# Patient Record
Sex: Female | Born: 1955 | Hispanic: No | Marital: Married | Smoking: Current every day smoker
Health system: Southern US, Community
[De-identification: ages and names within clinical notes are randomized; demographics above are authoritative.]

## PROBLEM LIST (undated history)

## (undated) ENCOUNTER — Ambulatory Visit: Admission: EM | Payer: No Typology Code available for payment source | Source: Home / Self Care

## (undated) DIAGNOSIS — R5383 Other fatigue: Secondary | ICD-10-CM

## (undated) DIAGNOSIS — R053 Chronic cough: Secondary | ICD-10-CM

## (undated) DIAGNOSIS — F411 Generalized anxiety disorder: Secondary | ICD-10-CM

## (undated) DIAGNOSIS — F32A Depression, unspecified: Secondary | ICD-10-CM

## (undated) DIAGNOSIS — E785 Hyperlipidemia, unspecified: Secondary | ICD-10-CM

## (undated) HISTORY — DX: Depression, unspecified: F32.A

## (undated) HISTORY — DX: Chronic cough: R05.3

## (undated) HISTORY — DX: Generalized anxiety disorder: F41.1

## (undated) HISTORY — DX: Other fatigue: R53.83

## (undated) HISTORY — PX: NO PAST SURGERIES: SHX2092

## (undated) HISTORY — DX: Hyperlipidemia, unspecified: E78.5

---

## 2000-10-24 ENCOUNTER — Emergency Department (HOSPITAL_COMMUNITY): Admission: EM | Admit: 2000-10-24 | Discharge: 2000-10-24 | Payer: Self-pay | Admitting: Emergency Medicine

## 2000-10-24 ENCOUNTER — Encounter: Payer: Self-pay | Admitting: Emergency Medicine

## 2004-10-31 ENCOUNTER — Emergency Department (HOSPITAL_COMMUNITY): Admission: EM | Admit: 2004-10-31 | Discharge: 2004-11-01 | Payer: Self-pay | Admitting: Emergency Medicine

## 2008-04-23 ENCOUNTER — Ambulatory Visit: Payer: Self-pay | Admitting: General Practice

## 2008-05-01 ENCOUNTER — Ambulatory Visit: Payer: Self-pay | Admitting: General Practice

## 2008-05-26 ENCOUNTER — Encounter: Admission: RE | Admit: 2008-05-26 | Discharge: 2008-05-26 | Payer: Self-pay | Admitting: Family Medicine

## 2008-10-30 ENCOUNTER — Encounter: Admission: RE | Admit: 2008-10-30 | Discharge: 2008-10-30 | Payer: Self-pay | Admitting: General Surgery

## 2009-08-24 IMAGING — US ULTRASOUND LEFT BREAST
1 series · 17 of 25 positions shown · non-contrast
Comparison: none

REASON FOR EXAM: Left breast nodularity
COMMENTS:

[Series 1: ultrasound left breast · 17 of 28 slices shown]
[im 1/28]
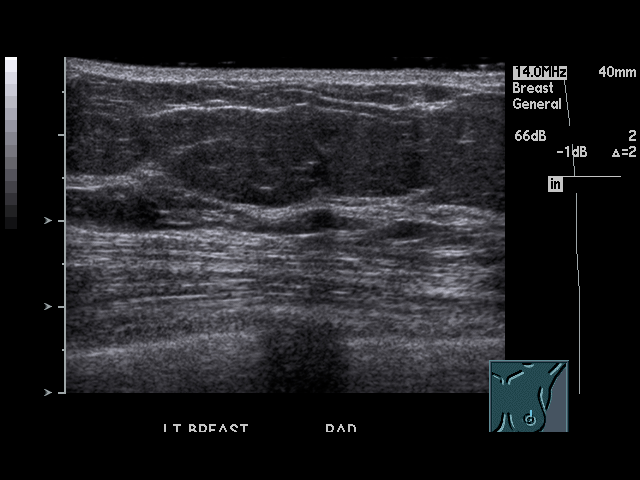
[im 3/28]
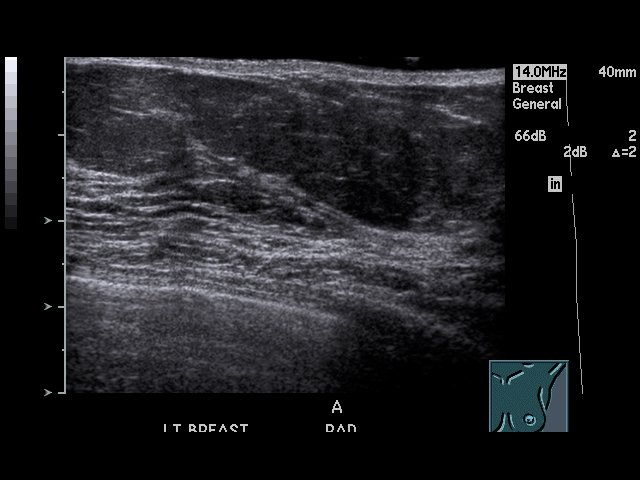
[im 4/28]
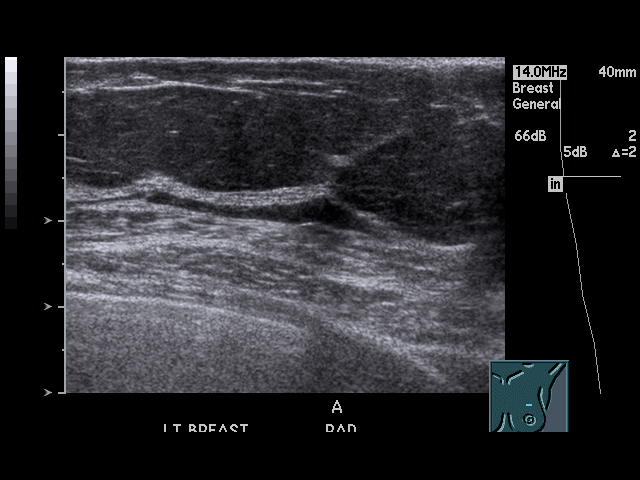
[im 6/28]
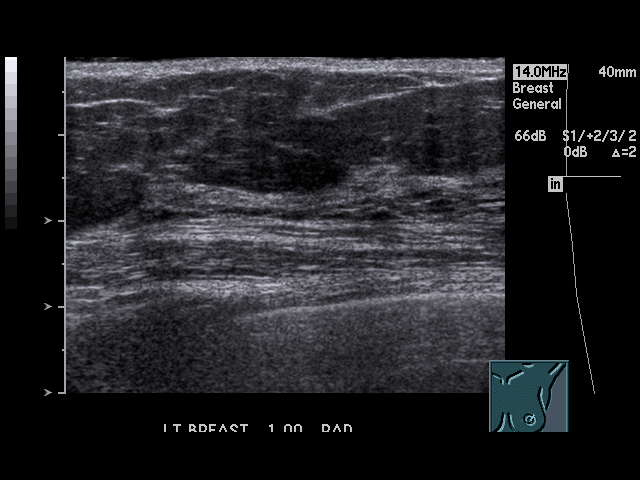
[im 7/28]
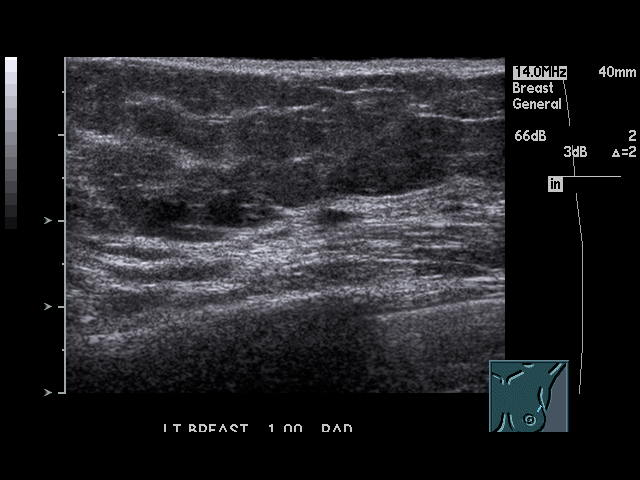
[im 10/28]
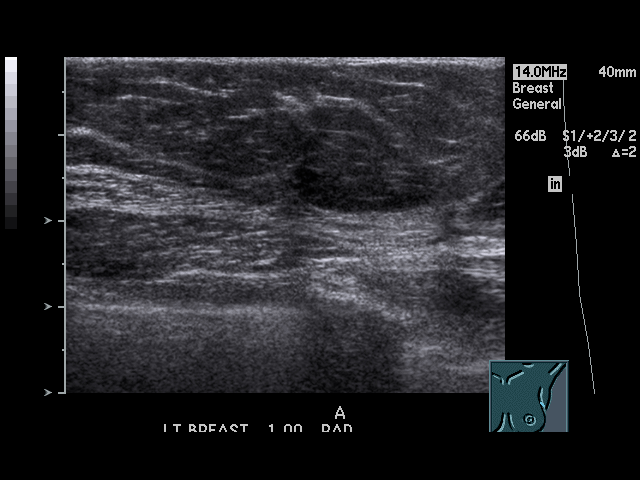
[im 11/28]
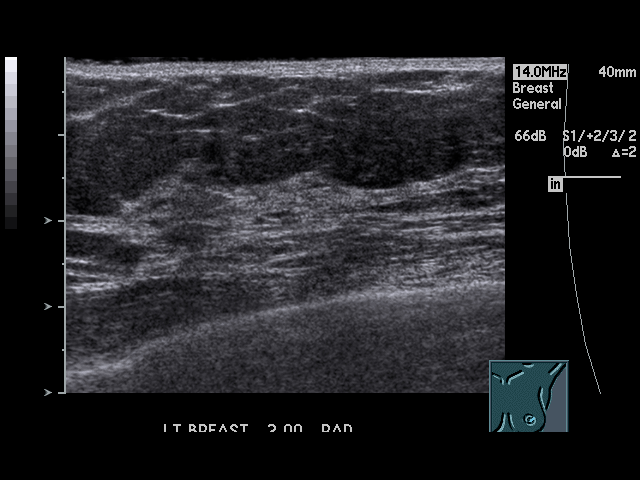
[im 13/28]
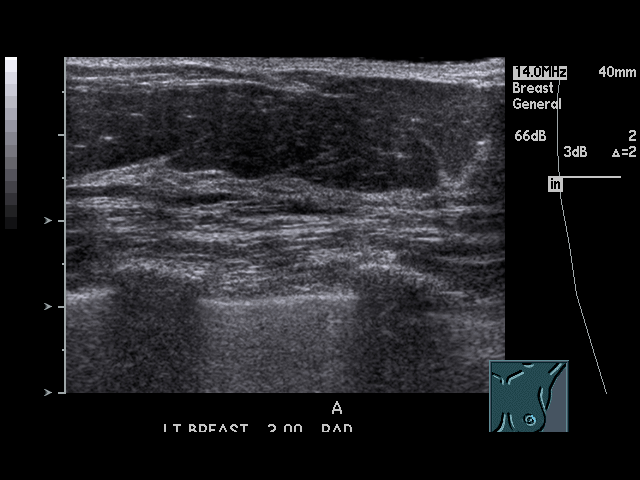
[im 14/28]
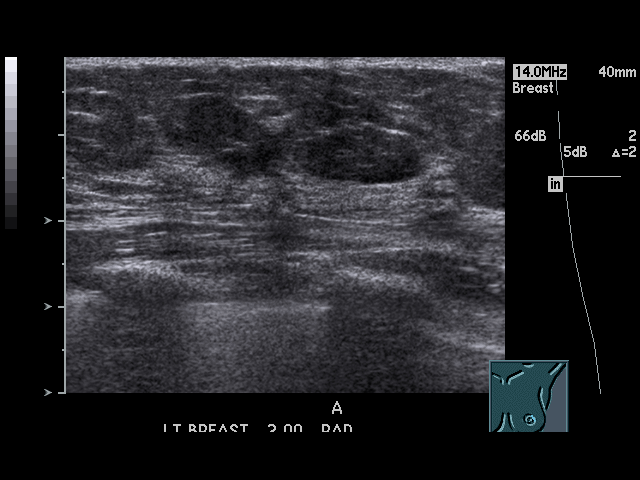
[im 15/28]
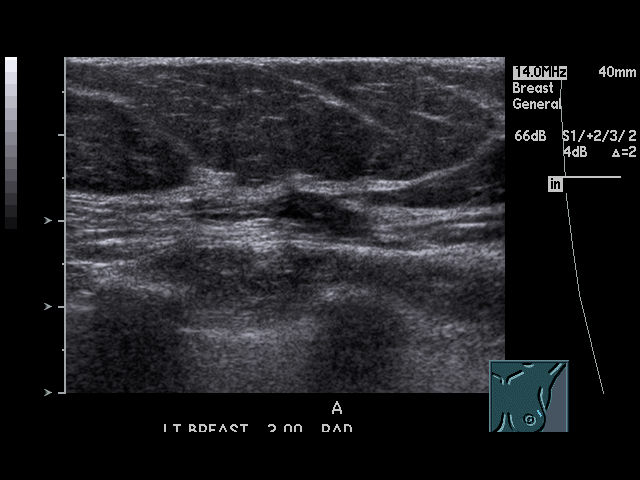
[im 17/28]
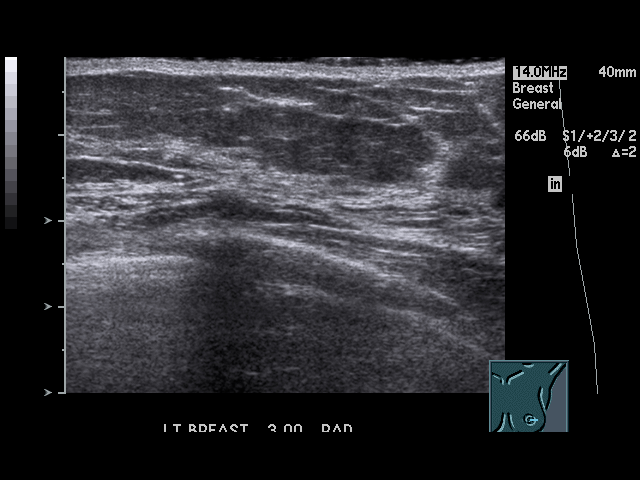
[im 19/28]
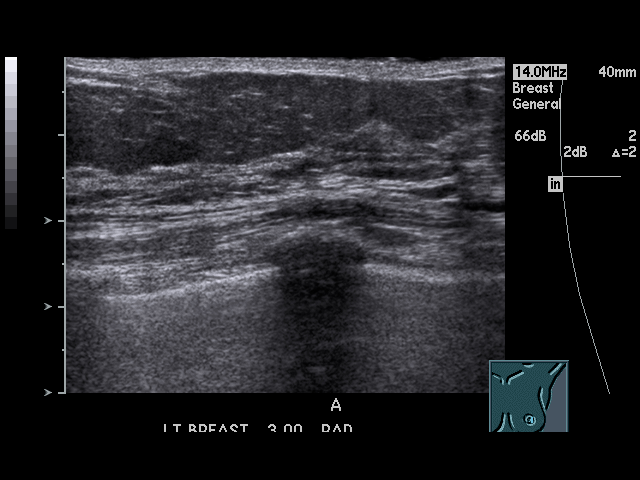
[im 21/28]
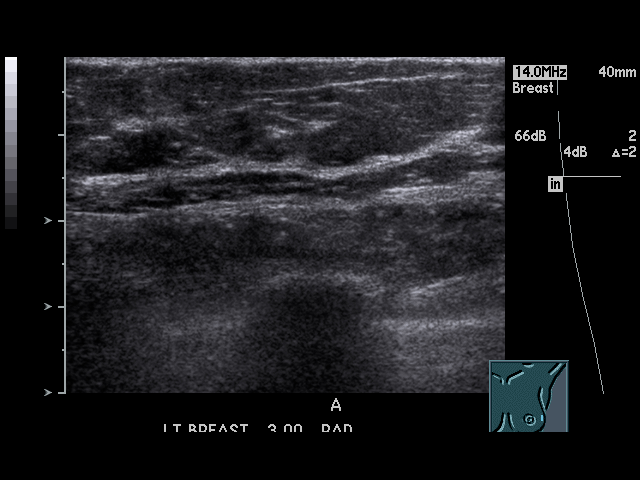
[im 22/28]
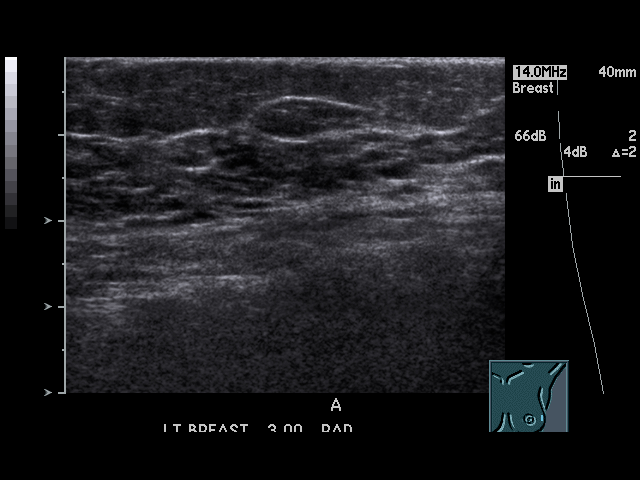
[im 24/28]
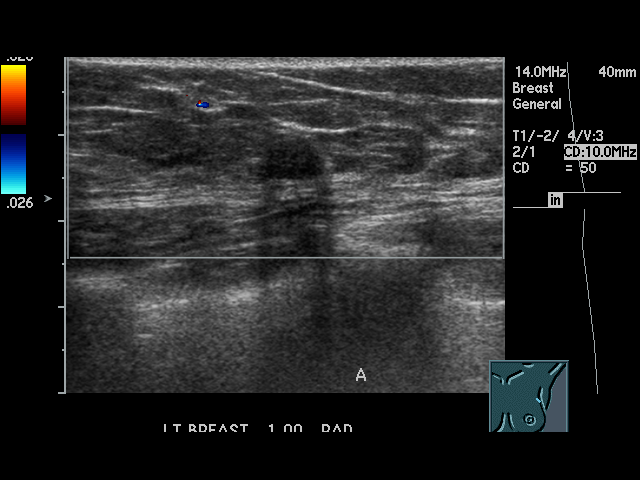
[im 25/28]
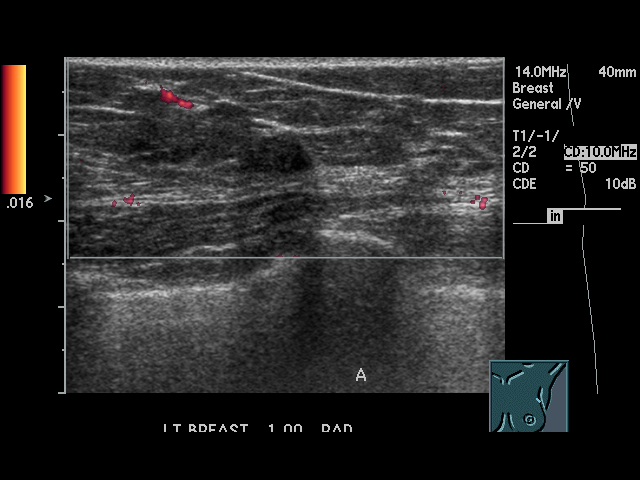
[im 28/28]
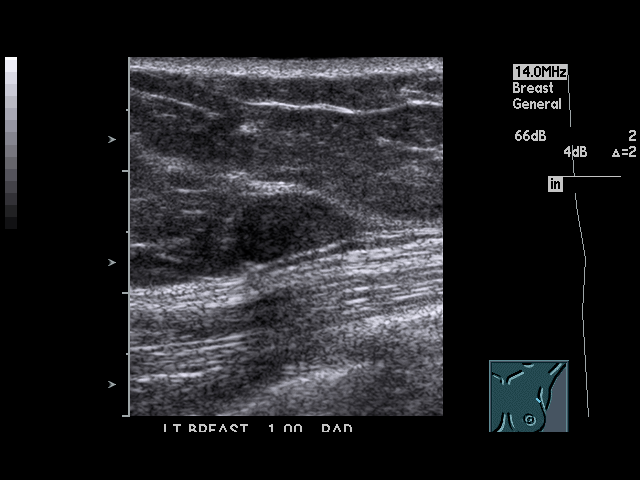

[17 of 25 positions shown; findings below may reference images not displayed]

PROCEDURE:     US  - US BREAST LEFT  - May 01, 2008 [DATE]

RESULT:     Directed ultrasound to the upper outer aspect of the LEFT breast
reveals an area of decreased echogenicity with distal shadowing that is
reasonably well marginated at approximately the 1 o'clock position. It
measures 8.0 x 5.0 x 9.0 mm. I do not see evidence of simple cysts.
IMPRESSION: There is an indeterminate nodule at the 1 o'clock position
of the LEFT breast. Please see the dictation of the mammogram of this same
day for final recommendations and BI-RADS classification.

## 2020-04-27 ENCOUNTER — Emergency Department (HOSPITAL_COMMUNITY): Payer: PRIVATE HEALTH INSURANCE

## 2020-04-27 ENCOUNTER — Emergency Department (HOSPITAL_COMMUNITY)
Admission: EM | Admit: 2020-04-27 | Discharge: 2020-04-28 | Disposition: A | Discharge status: Home / Self Care | Payer: PRIVATE HEALTH INSURANCE | Attending: Emergency Medicine | Admitting: Emergency Medicine

## 2020-04-27 ENCOUNTER — Encounter (HOSPITAL_COMMUNITY): Payer: Self-pay

## 2020-04-27 ENCOUNTER — Other Ambulatory Visit: Payer: Self-pay

## 2020-04-27 DIAGNOSIS — R059 Cough, unspecified: Secondary | ICD-10-CM | POA: Diagnosis present

## 2020-04-27 DIAGNOSIS — U071 COVID-19: Secondary | ICD-10-CM | POA: Diagnosis not present

## 2020-04-27 LAB — BASIC METABOLIC PANEL
Anion gap: 13 (ref 5–15)
BUN: 9 mg/dL (ref 8–23)
CO2: 26 mmol/L (ref 22–32)
Calcium: 9.3 mg/dL (ref 8.9–10.3)
Chloride: 100 mmol/L (ref 98–111)
Creatinine, Ser: 0.67 mg/dL (ref 0.44–1.00)
GFR, Estimated: >60 mL/min (ref >60)
Glucose, Bld: 173 mg/dL — ABNORMAL HIGH (ref 70–99)
Potassium: 3.7 mmol/L (ref 3.5–5.1)
Sodium: 139 mmol/L (ref 135–145)

## 2020-04-27 LAB — CBC
HCT: 42.7 % (ref 36.0–46.0)
Hemoglobin: 13.6 g/dL (ref 12.0–15.0)
MCH: 27.8 pg (ref 26.0–34.0)
MCHC: 31.9 g/dL (ref 30.0–36.0)
MCV: 87.1 fL (ref 80.0–100.0)
Platelets: 223 10*3/uL (ref 150–400)
RBC: 4.9 MIL/uL (ref 3.87–5.11)
RDW: 13.9 % (ref 11.5–15.5)
WBC: 4.6 10*3/uL (ref 4.0–10.5)
nRBC: 0 % (ref 0.0–0.2)

## 2020-04-27 NOTE — ED Triage Notes (Signed)
Bosnian interpreter used for triage:  Pt sent from UC for further evaluation, pt tested positive for COVID today, symptoms started on Friday. Pt reports chills, fatigue, no cough or SOB. Resp e.u at this time.

## 2020-04-28 ENCOUNTER — Telehealth: Payer: Self-pay | Admitting: Registered Nurse

## 2020-04-28 ENCOUNTER — Emergency Department (HOSPITAL_COMMUNITY): Payer: PRIVATE HEALTH INSURANCE

## 2020-04-28 ENCOUNTER — Encounter: Payer: Self-pay | Admitting: Registered Nurse

## 2020-04-28 DIAGNOSIS — U071 COVID-19: Secondary | ICD-10-CM

## 2020-04-28 MED ORDER — ONDANSETRON 4 MG PO TBDP: 4.0000 mg | ORAL_TABLET | Freq: Three times a day (TID) | ORAL | 0 refills | Status: DC | PRN

## 2020-04-28 MED ORDER — BENZONATATE 100 MG PO CAPS: 100.0000 mg | ORAL_CAPSULE | Freq: Three times a day (TID) | ORAL | 0 refills | Status: DC

## 2020-04-28 MED ORDER — ALBUTEROL SULFATE HFA 108 (90 BASE) MCG/ACT IN AERS
1.0000 | INHALATION_SPRAY | Freq: Four times a day (QID) | RESPIRATORY_TRACT | 0 refills | Status: DC | PRN
Start: 2020-04-28 — End: 2021-08-27

## 2020-04-28 NOTE — Discharge Instructions (Addendum)
Take tylenol 2 pills 4 times a day and motrin 4 pills 3 times a day.  Drink plenty of fluids.  Return for worsening shortness of breath, headache, confusion. Follow up with your family doctor.   

## 2020-04-28 NOTE — Patient Instructions (Signed)
How to Use a Metered Dose Inhaler A metered dose inhaler is a handheld device for taking medicine that must be breathed into the lungs (inhaled). The device can be used to deliver a variety of inhaled medicines, including:  Quick relief or rescue medicines, such as bronchodilators.  Controller medicines, such as corticosteroids. The medicine is delivered by pushing down on a metal canister to release a preset amount of spray and medicine. Each device contains the amount of medicine that is needed for a preset number of uses (inhalations). Your health care provider may recommend that you use a spacer with your inhaler to help you take the medicine more effectively. A spacer is a plastic tube with a mouthpiece on one end and an opening that connects to the inhaler on the other end. A spacer holds the medicine in a tube for a short time, which allows you to inhale more medicine. What are the risks? If you do not use your inhaler correctly, medicine might not reach your lungs to help you breathe. Inhaler medicine can cause side effects, such as:  Mouth or throat infection.  Cough.  Hoarseness.  Headache.  Nausea and vomiting.  Lung infection (pneumonia) in people who have a lung condition called COPD. How to use a metered dose inhaler without a spacer  1. Remove the cap from the inhaler. 2. If you are using the inhaler for the first time, shake it for 5 seconds, turn it away from your face, then release 4 puffs into the air. This is called priming. 3. Shake the inhaler for 5 seconds. 4. Position the inhaler so the top of the canister faces up. 5. Put your index finger on the top of the medicine canister. Support the bottom of the inhaler with your thumb. 6. Breathe out normally and as completely as possible, away from the inhaler. 7. Either place the inhaler between your teeth and close your lips tightly around the mouthpiece, or hold the inhaler 1-2 inches (2.5-5 cm) away from your open  mouth. Keep your tongue down out of the way. If you are unsure which technique to use, ask your health care provider. 8. Press the canister down with your index finger to release the medicine, then inhale deeply and slowly through your mouth (not your nose) until your lungs are completely filled. Inhaling should take 4-6 seconds. 9. Hold the medicine in your lungs for 5-10 seconds (10 seconds is best). This helps the medicine get into the small airways of your lungs. 10. With your lips in a tight circle (pursed), breathe out slowly. 11. Repeat steps 3-10 until you have taken the number of puffs that your health care provider directed. Wait about 1 minute between puffs or as directed. 12. Put the cap on the inhaler. 13. If you are using a steroid inhaler, rinse your mouth with water, gargle, and spit out the water. Do not swallow the water. How to use a metered dose inhaler with a spacer  1. Remove the cap from the inhaler. 2. If you are using the inhaler for the first time, shake it for 5 seconds, turn it away from your face, then release 4 puffs into the air. This is called priming. 3. Shake the inhaler for 5 seconds. 4. Place the open end of the spacer onto the inhaler mouthpiece. 5. Position the inhaler so the top of the canister faces up and the spacer mouthpiece faces you. 6. Put your index finger on the top of the medicine canister.   Support the bottom of the inhaler and the spacer with your thumb. 7. Breathe out normally and as completely as possible, away from the spacer. 8. Place the spacer between your teeth and close your lips tightly around it. Keep your tongue down out of the way. 9. Press the canister down with your index finger to release the medicine, then inhale deeply and slowly through your mouth (not your nose) until your lungs are completely filled. Inhaling should take 4-6 seconds. 10. Hold the medicine in your lungs for 5-10 seconds (10 seconds is best). This helps the  medicine get into the small airways of your lungs. 11. With your lips in a tight circle (pursed), breathe out slowly. 12. Repeat steps 3-11 until you have taken the number of puffs that your health care provider directed. Wait about 1 minute between puffs or as directed. 13. Remove the spacer from the inhaler and put the cap on the inhaler. 14. If you are using a steroid inhaler, rinse your mouth with water, gargle, and spit out the water. Do not swallow the water. Follow these instructions at home:  Take your inhaled medicine only as told by your health care provider. Do not use the inhaler more than directed by your health care provider.  Keep all follow-up visits as told by your health care provider. This is important.  If your inhaler has a counter, you can check it to determine how full your inhaler is. If your inhaler does not have a counter, ask your health care provider when you will need to refill your inhaler and write the refill date on a calendar or on your inhaler canister. Note that you cannot know when an inhaler is empty by shaking it.  Follow directions on the package insert for care and cleaning of your inhaler and spacer. Contact a health care provider if:  Symptoms are only partially relieved with your inhaler.  You are having trouble using your inhaler.  You have an increase in phlegm.  You have headaches. Get help right away if:  You feel little or no relief after using your inhaler.  You have dizziness.  You have a fast heart rate.  You have chills or a fever.  You have night sweats.  There is blood in your phlegm. Summary  A metered dose inhaler is a handheld device for taking medicine that must be breathed into the lungs (inhaled).  The medicine is delivered by pushing down on a metal canister to release a preset amount of spray and medicine.  Each device contains the amount of medicine that is needed for a preset number of uses (inhalations). This  information is not intended to replace advice given to you by your health care provider. Make sure you discuss any questions you have with your health care provider. Document Revised: 05/26/2017 Document Reviewed: 05/03/2016 Elsevier Patient Education  2020 [employer].  How to Use a Metered Dose Inhaler A metered dose inhaler is a handheld device for taking medicine that must be breathed into the lungs (inhaled). The device can be used to deliver a variety of inhaled medicines, including:  Quick relief or rescue medicines, such as bronchodilators.  Controller medicines, such as corticosteroids. The medicine is delivered by pushing down on a metal canister to release a preset amount of spray and medicine. Each device contains the amount of medicine that is needed for a preset number of uses (inhalations). Your health care provider may recommend that you use a spacer with  your inhaler to help you take the medicine more effectively. A spacer is a plastic tube with a mouthpiece on one end and an opening that connects to the inhaler on the other end. A spacer holds the medicine in a tube for a short time, which allows you to inhale more medicine. What are the risks? If you do not use your inhaler correctly, medicine might not reach your lungs to help you breathe. Inhaler medicine can cause side effects, such as:  Mouth or throat infection.  Cough.  Hoarseness.  Headache.  Nausea and vomiting.  Lung infection (pneumonia) in people who have a lung condition called COPD. How to use a metered dose inhaler without a spacer  14. Remove the cap from the inhaler. 15. If you are using the inhaler for the first time, shake it for 5 seconds, turn it away from your face, then release 4 puffs into the air. This is called priming. 16. Shake the inhaler for 5 seconds. 17. Position the inhaler so the top of the canister faces up. 18. Put your index finger on the top of the medicine canister. Support  the bottom of the inhaler with your thumb. 19. Breathe out normally and as completely as possible, away from the inhaler. 20. Either place the inhaler between your teeth and close your lips tightly around the mouthpiece, or hold the inhaler 1-2 inches (2.5-5 cm) away from your open mouth. Keep your tongue down out of the way. If you are unsure which technique to use, ask your health care provider. 21. Press the canister down with your index finger to release the medicine, then inhale deeply and slowly through your mouth (not your nose) until your lungs are completely filled. Inhaling should take 4-6 seconds. 22. Hold the medicine in your lungs for 5-10 seconds (10 seconds is best). This helps the medicine get into the small airways of your lungs. 23. With your lips in a tight circle (pursed), breathe out slowly. 24. Repeat steps 3-10 until you have taken the number of puffs that your health care provider directed. Wait about 1 minute between puffs or as directed. 25. Put the cap on the inhaler. 26. If you are using a steroid inhaler, rinse your mouth with water, gargle, and spit out the water. Do not swallow the water. How to use a metered dose inhaler with a spacer  15. Remove the cap from the inhaler. 16. If you are using the inhaler for the first time, shake it for 5 seconds, turn it away from your face, then release 4 puffs into the air. This is called priming. 17. Shake the inhaler for 5 seconds. 18. Place the open end of the spacer onto the inhaler mouthpiece. 19. Position the inhaler so the top of the canister faces up and the spacer mouthpiece faces you. 20. Put your index finger on the top of the medicine canister. Support the bottom of the inhaler and the spacer with your thumb. 21. Breathe out normally and as completely as possible, away from the spacer. 22. Place the spacer between your teeth and close your lips tightly around it. Keep your tongue down out of the way. 23. Press the  canister down with your index finger to release the medicine, then inhale deeply and slowly through your mouth (not your nose) until your lungs are completely filled. Inhaling should take 4-6 seconds. 24. Hold the medicine in your lungs for 5-10 seconds (10 seconds is best). This helps the medicine get into  the small airways of your lungs. 25. With your lips in a tight circle (pursed), breathe out slowly. 26. Repeat steps 3-11 until you have taken the number of puffs that your health care provider directed. Wait about 1 minute between puffs or as directed. 27. Remove the spacer from the inhaler and put the cap on the inhaler. 28. If you are using a steroid inhaler, rinse your mouth with water, gargle, and spit out the water. Do not swallow the water. Follow these instructions at home:  Take your inhaled medicine only as told by your health care provider. Do not use the inhaler more than directed by your health care provider.  Keep all follow-up visits as told by your health care provider. This is important.  If your inhaler has a counter, you can check it to determine how full your inhaler is. If your inhaler does not have a counter, ask your health care provider when you will need to refill your inhaler and write the refill date on a calendar or on your inhaler canister. Note that you cannot know when an inhaler is empty by shaking it.  Follow directions on the package insert for care and cleaning of your inhaler and spacer. Contact a health care provider if:  Symptoms are only partially relieved with your inhaler.  You are having trouble using your inhaler.  You have an increase in phlegm.  You have headaches. Get help right away if:  You feel little or no relief after using your inhaler.  You have dizziness.  You have a fast heart rate.  You have chills or a fever.  You have night sweats.  There is blood in your phlegm. Summary  A metered dose inhaler is a handheld device  for taking medicine that must be breathed into the lungs (inhaled).  The medicine is delivered by pushing down on a metal canister to release a preset amount of spray and medicine.  Each device contains the amount of medicine that is needed for a preset number of uses (inhalations). This information is not intended to replace advice given to you by your health care provider. Make sure you discuss any questions you have with your health care provider. Document Revised: 05/26/2017 Document Reviewed: 05/03/2016 Elsevier Patient Education  2020 [employer].

## 2020-04-28 NOTE — Telephone Encounter (Signed)
New Melanie Arroyo contacted via telephone english second language used interpretor Melanie Arroyo preferred [place] conference call for [place].   RN Rolly Salter, Melanie Arroyo, myself and interpretor on phoneline during entire call. Melanie Arroyo reported she had been feeling fatigued and did not feel she could complete her shift at work so called in sick.  Works in [employer] bi[employer] ([employer]ing). Melanie Arroyo does not participate in employer [employer] Well program and has only participated in mammogram breast cancer screenings in the past 2009.  Paper chart located at [facility] and Epic reviewed.   HR notified clinic staff to follow up with Melanie Arroyo as went to UC/ER and was notified had positive covid test.  Melanie Arroyo denied wheezing, difficulty breathing, blue lips/face, confusion or syncope.  Discussed if any of these happen recommend same day re-evaluation by provider.  ER recommended if worsening chest pain, difficulty breathing, confusion, and/or passing out.  Melanie Arroyo to notify clinic staff if increased effort breathing or sp02 worsening.  Coworker bringing sp02 home monitor borrowing from clinic to Melanie Arroyo home after work today.  Discussed 94-99 normal and below 90 not normal.  Noted in epic Melanie Arroyo baseline sp02 RA 97% on previous visit.  94% today in ER.  Melanie Arroyo spoke full sentences without difficulty A&Ox3 respirations even and unlabored during auditory telephone call duration 20 minutes.  Electronic Rx for albuterol MDI 1-2puffs po q4-6h prn protracted cough/wheeze/chest tightness.  ER provider rx for tessalon pearles 100mg  po TID prn cough and zofran 4mg  prn n/v  HR team notified Melanie Arroyo quarantine to end 03 May 2020 RTW 04 May 2020 as long as symptoms improving and no nausea/vomiting/diarrhea/fever or chills 24 hours prior to return to work.  Melanie Arroyo last onsite Th 28 Oct and symptoms started 28 Oct after work fatigue. Called out sick 10/29 and 11/1.  Notified supervisor positive covid test 11/2 13/1 who notified  HR team.  Day 1 quarantine 04/24/2020.  Pt began quarantine today.  Tested on day 3 urgent care per Melanie Arroyo. Melanie Arroyo did not develop sx of cough, trouble breathing, chest pain, nausea, vomiting, diarrhea, sore throat, HA, body aches, or fever.   Quarantine 10 day symptomatic recommended. Pt lives in [place].  [facility] preferred pharmacy.  Son planning to pick up her ER medication rx today.  Reviewed her Rx with Melanie Arroyo purpose and dosing tessalon pearles po TID prn cough, albuterol MDI 1-2 puffs po q4-6h prn cough/trouble breathing/low 02 and zofran prn n/v   Reviewed possible Covid sx including cough, shortness of breath with exertion or at rest, runny nose, congestion, sinus pain/pressure, sore throat, fever/chills, body aches, fatigue, loss of taste/smell, GI symptoms of nausea/vomiting/diarrhea. Also reviewed same day/emergent eval/ER precautions of dizziness/syncope, confusion, blue tint to lips/face, severe shortness of breath/difficulty breathing/wheezing/chest pain.   Melanie Arroyo to isolate in own room and if possible use only one bathroom if living with others in home.  Wear mask when out of room to help prevent spread to others in household.  Discussed monoclonal antibody (Mab) treatment with Melanie Arroyo.  She reported she doesn't feel sick enough to need that extra treatment at this time. Discussed it can only be given within 10 days of symptom start and thus 5 days remaining if decides to change mind or worsening symptoms and desires Mab treatment at [facility] I would need to put in request/order.  Discussed with Melanie Arroyo antibodies would help her body fight infection and possibly prevent hospitalization/need for oxygen.  Risk factors age 12, borderline hypertension and BMI 27. She denied health problems  except for occasional elevated blood pressure.  Not taking any medications or supplements on a daily basis.  Chest xray [place] 04/27/2020: COMPARISON:  10/31/2004  FINDINGS: The  lungs are symmetrically well expanded. Patchy peripheral interstitial and airspace infiltrate is seen within the mid and lower lung zones, likely infectious or inflammatory in the acute setting. No pneumothorax or pleural effusion. Cardiac size within normal limits. No acute bone abnormality.  IMPRESSION: Patchy multifocal pulmonary infiltrates, likely infectious or inflammatory in the acute setting.   Electronically Signed   By: Helyn Numbers MD   On: 04/28/2020 03:05  Labs yesterday Results for ANALIZ, TVEDT (MRN [id number]) as of 04/28/2020 18:22  Ref. Range 04/27/2020 18:10  Sodium Latest Ref Range: 135 - 145 mmol/L 139  Potassium Latest Ref Range: 3.5 - 5.1 mmol/L 3.7  Chloride Latest Ref Range: 98 - 111 mmol/L 100  CO2 Latest Ref Range: 22 - 32 mmol/L 26  Glucose Latest Ref Range: 70 - 99 mg/dL 242 (H)  BUN Latest Ref Range: 8 - 23 mg/dL 9  Creatinine Latest Ref Range: 0.44 - 1.00 mg/dL 6.83  Calcium Latest Ref Range: 8.9 - 10.3 mg/dL 9.3  Anion gap Latest Ref Range: 5 - 15  13  GFR, Estimated Latest Ref Range: >60 mL/min >60  WBC Latest Ref Range: 4.0 - 10.5 K/uL 4.6  RBC Latest Ref Range: 3.87 - 5.11 MIL/uL 4.90  Hemoglobin Latest Ref Range: 12.0 - 15.0 g/dL 41.9  HCT Latest Ref Range: 36 - 46 % 42.7  MCV Latest Ref Range: 80.0 - 100.0 fL 87.1  MCH Latest Ref Range: 26.0 - 34.0 pg 27.8  MCHC Latest Ref Range: 30.0 - 36.0 g/dL 62.2  RDW Latest Ref Range: 11.5 - 15.5 % 13.9  Platelets Latest Ref Range: 150 - 400 K/uL 223  nRBC Latest Ref Range: 0.0 - 0.2 % 0.0     Pt verbalized understanding and agreement with plan of care. No further questions/concerns at this time. Pt reminded to contact clinic with any changes in sx or questions/concerns [phone].

## 2020-04-28 NOTE — ED Notes (Addendum)
Pt able to ambulate without difficulty while maintaining O2 sats of 94%

## 2020-04-28 NOTE — ED Provider Notes (Signed)
[facility] EMERGENCY DEPARTMENT Provider Note   CSN: [id number] Arrival date & time: 04/27/20  1738     History Chief Complaint  Patient presents with  . Covid Positive    Melanie Arroyo is a 64 y.o. female.  64 yo F with a cc of cough congestion, sob. Going on for about 4 days now. Went to urgent care and they were concerned for multifocal pna on xray and so sent here for eval.    The history is provided by the patient. The history is limited by a language barrier. A language interpreter was used (son).  Illness Severity:  Moderate Onset quality:  Gradual Duration:  4 days Timing:  Constant Progression:  Worsening Chronicity:  New Associated symptoms: cough, fever, headaches, myalgias and shortness of breath   Associated symptoms: no chest pain, no congestion, no nausea, no rhinorrhea, no vomiting and no wheezing        History reviewed. No pertinent past medical history.  There are no problems to display for this patient.   History reviewed. No pertinent surgical history.   OB History   No obstetric history on file.     No family history on file.  Social History   Tobacco Use  . Smoking status: Not on file  Substance Use Topics  . Alcohol use: Not on file  . Drug use: Not on file    Home Medications Prior to Admission medications   Medication Sig Start Date End Date Taking? Authorizing Provider  benzonatate (TESSALON) 100 MG capsule Take 1 capsule (100 mg total) by mouth every 8 (eight) hours. 04/28/20   Melene Plan, DO  ondansetron (ZOFRAN ODT) 4 MG disintegrating tablet Take 1 tablet (4 mg total) by mouth every 8 (eight) hours as needed for nausea or vomiting. 04/28/20   Melene Plan, DO    Allergies    Patient has no allergy information on record.  Review of Systems   Review of Systems  Constitutional: Positive for chills and fever.  HENT: Negative for congestion and rhinorrhea.   Eyes: Negative for redness and visual  disturbance.  Respiratory: Positive for cough and shortness of breath. Negative for wheezing.   Cardiovascular: Negative for chest pain and palpitations.  Gastrointestinal: Negative for nausea and vomiting.  Genitourinary: Negative for dysuria and urgency.  Musculoskeletal: Positive for myalgias. Negative for arthralgias.  Skin: Negative for pallor and wound.  Neurological: Positive for headaches. Negative for dizziness.    Physical Exam Updated Vital Signs BP 117/82 (BP Location: Left Arm)   Pulse 91   Temp 98.1 F (36.7 C) (Oral)   Resp 18   Ht 5' 7.72" (1.72 m)   Wt 82 kg   SpO2 94%   BMI 27.72 kg/m   Physical Exam Vitals and nursing note reviewed.  Constitutional:      General: She is not in acute distress.    Appearance: She is well-developed. She is not diaphoretic.  HENT:     Head: Normocephalic and atraumatic.  Eyes:     Pupils: Pupils are equal, round, and reactive to light.  Cardiovascular:     Rate and Rhythm: Normal rate and regular rhythm.     Heart sounds: No murmur heard.  No friction rub. No gallop.   Pulmonary:     Effort: Pulmonary effort is normal.     Breath sounds: No wheezing or rales.     Comments: Tachypnea Abdominal:     General: There is no distension.  Palpations: Abdomen is soft.     Tenderness: There is no abdominal tenderness.  Musculoskeletal:        General: No tenderness.     Cervical back: Normal range of motion and neck supple.  Skin:    General: Skin is warm and dry.  Neurological:     Mental Status: She is alert and oriented to person, place, and time.  Psychiatric:        Behavior: Behavior normal.     ED Results / Procedures / Treatments   Labs (all labs ordered are listed, but only abnormal results are displayed) Labs Reviewed  BASIC METABOLIC PANEL - Abnormal; Notable for the following components:      Result Value   Glucose, Bld 173 (*)    All other components within normal limits  CBC     EKG None  Radiology DG Chest Port 1 View  Result Date: 04/28/2020 CLINICAL DATA:  Dyspnea EXAM: PORTABLE CHEST 1 VIEW COMPARISON:  10/31/2004 FINDINGS: The lungs are symmetrically well expanded. Patchy peripheral interstitial and airspace infiltrate is seen within the mid and lower lung zones, likely infectious or inflammatory in the acute setting. No pneumothorax or pleural effusion. Cardiac size within normal limits. No acute bone abnormality. IMPRESSION: Patchy multifocal pulmonary infiltrates, likely infectious or inflammatory in the acute setting. Electronically Signed   By: Helyn Numbers MD   On: 04/28/2020 03:05    Procedures Procedures (including critical care time)  Medications Ordered in ED Medications - No data to display  ED Course  I have reviewed the triage vital signs and the nursing notes.  Pertinent labs & imaging results that were available during my care of the patient were reviewed by me and considered in my medical decision making (see chart for details).    MDM Rules/Calculators/A&P                          64 yo F with a chief complaints of cough congestion fevers chills myalgias.  Going on for about 4 days.  Was seen at urgent care and there is some concern for pneumonia on her chest x-ray and so sent to the ED for evaluation.  I discussed with her that this is likely evolution of Covid pneumonia.  I would hold off on antibiotics at this time.  Her oxygen saturation is somewhat the low though not hypoxic at rest.  Will have ambulate in the room.  Ambulates without hypoxia.  D/c home. Covid clinic follow up.  4:16 AM:  I have discussed the diagnosis/risks/treatment options with the patient and family and believe the pt to be eligible for discharge home to follow-up with PCP. We also discussed returning to the ED immediately if new or worsening sx occur. We discussed the sx which are most concerning (e.g., sudden worsening pain, fever, inability to tolerate by  mouth) that necessitate immediate return. Medications administered to the patient during their visit and any new prescriptions provided to the patient are listed below.  Medications given during this visit Medications - No data to display   The patient appears reasonably screen and/or stabilized for discharge and I doubt any other medical condition or other [facility] requiring further screening, evaluation, or treatment in the ED at this time prior to discharge.   Final Clinical Impression(s) / ED Diagnoses Final diagnoses:  COVID-19 virus infection    Rx / DC Orders ED Discharge Orders  Ordered    ondansetron (ZOFRAN ODT) 4 MG disintegrating tablet  Every 8 hours PRN        04/28/20 0414    benzonatate (TESSALON) 100 MG capsule  Every 8 hours        04/28/20 0414           Melene Plan, DO 04/28/20 [phone]

## 2020-04-30 NOTE — Telephone Encounter (Signed)
Coworker that was helping translate over the phone Tuesday has been texting with pt each day. She reports that pt told her she is feeling good. Appetite returned today. Denies ShOB. Has the albuterol inhaler but has not needed to use it. Her O2 sat is 98% and pulse 99 per oximeter sent to her from clinic. Fatigue is also mildly improved today. She has an appt scheduled with the post Covid care clinic at [facility] on 11/10.

## 2020-05-01 NOTE — Telephone Encounter (Signed)
Patient case discussed in clinic 04/30/2020 with RN Rolly Salter noted patient still doing well and post covid clinic appt scheduled.

## 2020-05-04 NOTE — Telephone Encounter (Signed)
Coworker spoke with pt by text. She reported feeling okay. Only lingering sx is fatigue. O2 sat 95% today. Between 95-97% on prior checks.  She did not report to work today due to fatigue. Appt with post-covid clinic is 11/10 at 1500. Advised that pt request work note for Mon-Wed and additional note if additional time off is needed per their provider as 10 day quarantine ended yesterday and pt was to RTW today 11/8. HR made aware of same.

## 2020-05-05 NOTE — Telephone Encounter (Signed)
Noted RN Rolly Salter to follow up with patient today with interpretor via telephone for symptom update.

## 2020-05-06 ENCOUNTER — Ambulatory Visit (INDEPENDENT_AMBULATORY_CARE_PROVIDER_SITE_OTHER): Payer: PRIVATE HEALTH INSURANCE | Admitting: Nurse Practitioner

## 2020-05-06 VITALS — BP 140/82 | HR 91 | Temp 96.6°F | Wt 194.0 lb

## 2020-05-06 DIAGNOSIS — R0602 Shortness of breath: Secondary | ICD-10-CM | POA: Diagnosis not present

## 2020-05-06 DIAGNOSIS — R059 Cough, unspecified: Secondary | ICD-10-CM | POA: Diagnosis not present

## 2020-05-06 DIAGNOSIS — J1282 Pneumonia due to coronavirus disease 2019: Secondary | ICD-10-CM

## 2020-05-06 DIAGNOSIS — R0683 Snoring: Secondary | ICD-10-CM

## 2020-05-06 DIAGNOSIS — U071 COVID-19: Secondary | ICD-10-CM

## 2020-05-06 MED ORDER — PREDNISONE 20 MG PO TABS: 20.0000 mg | ORAL_TABLET | Freq: Every day | ORAL | 0 refills | Status: AC

## 2020-05-06 MED ORDER — AZITHROMYCIN 250 MG PO TABS: ORAL_TABLET | ORAL | 0 refills | Status: DC

## 2020-05-06 MED ORDER — NICOTINE 14 MG/24HR TD PT24: 14.0000 mg | MEDICATED_PATCH | Freq: Every day | TRANSDERMAL | 0 refills | Status: DC

## 2020-05-06 NOTE — Progress Notes (Signed)
@Patient  ID: Melanie Arroyo, female    DOB: 04/18/1956, 64 y.o.   MRN: [id number]  Chief Complaint  Patient presents with  . New Patient (Initial Visit)    COVID 11/1 Sx: sob, faigue, upper thigh soreness    Referring provider: No ref. provider found   64 year old female everyday smoker with no significant health history.  HPI  Patient presents today for post COVID care clinic visit/ED follow-up.  Patient was seen in the ED on 04/27/2020.  She was prescribed Tessalon Perles.  Patient states that she is still having ongoing shortness of breath, fatigue, leg pain.  Patient does have a pulse oximeter has been checking her pulse ox at home.  She states that it does stay in the mid upper 90s during the day but has noticed that it sometimes low for second morning when she wakes up.  Patient states that she does have a history of snoring.  We discussed that she could have sleep apnea will refer her to pulmonary to be evaluated for this.  Patient is trying to stay active.  She states that she does have a nonproductive cough.  Patient has quit smoking since diagnosed with Covid.  We discussed that she could use nicotine patch to help her to quit smoking at this point. Denies f/c/s, n/v/d, hemoptysis, PND, chest pain or edema.       No Known Allergies   There is no immunization history on file for this patient.  History reviewed. No pertinent past medical history.  Tobacco History: Social History   Tobacco Use  Smoking Status Current Some Day Smoker  Smokeless Tobacco Never Used   Ready to quit: Not Answered Counseling given: Not Answered   Outpatient Encounter Medications as of 05/06/2020  Medication Sig  . albuterol (VENTOLIN HFA) 108 (90 Base) MCG/ACT inhaler Inhale 1-2 puffs into the lungs every 6 (six) hours as needed for wheezing or shortness of breath (protracted coughing).  . benzonatate (TESSALON) 100 MG capsule Take 1 capsule (100 mg total) by mouth every 8 (eight) hours.   13/03/2020 azithromycin (ZITHROMAX) 250 MG tablet Take 2 tablets (500 mg) on day 1, then take 1 tablet (250 mg) on days 2-5  . nicotine (NICODERM CQ - DOSED IN MG/24 HOURS) 14 mg/24hr patch Place 1 patch (14 mg total) onto the skin daily.  . ondansetron (ZOFRAN ODT) 4 MG disintegrating tablet Take 1 tablet (4 mg total) by mouth every 8 (eight) hours as needed for nausea or vomiting. (Patient not taking: Reported on 05/06/2020)  . predniSONE (DELTASONE) 20 MG tablet Take 1 tablet (20 mg total) by mouth daily with breakfast for 5 days.   No facility-administered encounter medications on file as of 05/06/2020.     Review of Systems  Review of Systems  Constitutional: Positive for fatigue. Negative for fever.  HENT: Negative.   Respiratory: Positive for cough and shortness of breath.   Cardiovascular: Negative.  Negative for chest pain, palpitations and leg swelling.  Gastrointestinal: Negative.   Musculoskeletal: Positive for myalgias.  Allergic/Immunologic: Negative.   Neurological: Negative.   Psychiatric/Behavioral: Negative.        Physical Exam  BP 140/82 (BP Location: Left Arm)   Pulse 91   Temp (!) 96.6 F (35.9 C)   Wt 194 lb 0.1 oz (88 kg)   SpO2 94%   BMI 29.75 kg/m   Wt Readings from Last 5 Encounters:  05/06/20 194 lb 0.1 oz (88 kg)  04/27/20 180 lb 12.4 oz (82 kg)  Physical Exam Vitals and nursing note reviewed.  Constitutional:      General: She is not in acute distress.    Appearance: She is well-developed.  Cardiovascular:     Rate and Rhythm: Normal rate and regular rhythm.  Pulmonary:     Effort: Pulmonary effort is normal.     Breath sounds: Normal breath sounds.  Musculoskeletal:     Right lower leg: No edema.     Left lower leg: No edema.  Neurological:     Mental Status: She is alert and oriented to person, place, and time.  Psychiatric:        Mood and Affect: Mood normal.        Behavior: Behavior normal.       Imaging: DG Chest Port  1 View  Result Date: 04/28/2020 CLINICAL DATA:  Dyspnea EXAM: PORTABLE CHEST 1 VIEW COMPARISON:  10/31/2004 FINDINGS: The lungs are symmetrically well expanded. Patchy peripheral interstitial and airspace infiltrate is seen within the mid and lower lung zones, likely infectious or inflammatory in the acute setting. No pneumothorax or pleural effusion. Cardiac size within normal limits. No acute bone abnormality. IMPRESSION: Patchy multifocal pulmonary infiltrates, likely infectious or inflammatory in the acute setting. Electronically Signed   By: Helyn Numbers MD   On: 04/28/2020 03:05     Assessment & Plan:   Pneumonia due to COVID-19 virus Cough:   Stay well hydrated  Stay active  Deep breathing exercises  May take tylenol or fever or pain  May take mucinex twice daily  Will order prednisone  Will order azithromycin  Will place referral to pulmonary - may need sleep study - snores at night - reports O2 sats are low in the mornings   Follow up:  Follow up in 3 weeks or sooner if needed - will need repeat imaging       Ivonne Andrew, NP 05/07/2020

## 2020-05-06 NOTE — Patient Instructions (Addendum)
Covid 19 Cough:   Stay well hydrated  Stay active  Deep breathing exercises  May take tylenol or fever or pain  May take mucinex twice daily  Will order prednisone  Will order azithromycin  Will place referral to pulmonary - may need sleep study - snores at night - reports O2 sats are low in the mornings   Follow up:  Follow up in 3 weeks or sooner if needed - will need repeat imaging

## 2020-05-07 DIAGNOSIS — R0602 Shortness of breath: Secondary | ICD-10-CM | POA: Insufficient documentation

## 2020-05-07 DIAGNOSIS — J1282 Pneumonia due to coronavirus disease 2019: Secondary | ICD-10-CM | POA: Insufficient documentation

## 2020-05-07 DIAGNOSIS — U071 COVID-19: Secondary | ICD-10-CM | POA: Insufficient documentation

## 2020-05-07 DIAGNOSIS — R059 Cough, unspecified: Secondary | ICD-10-CM | POA: Insufficient documentation

## 2020-05-07 DIAGNOSIS — R0683 Snoring: Secondary | ICD-10-CM | POA: Insufficient documentation

## 2020-05-07 NOTE — Telephone Encounter (Signed)
Noted please follow up with patient tomorrow via telephone.

## 2020-05-07 NOTE — Assessment & Plan Note (Signed)
Cough:   Stay well hydrated  Stay active  Deep breathing exercises  May take tylenol or fever or pain  May take mucinex twice daily  Will order prednisone  Will order azithromycin  Will place referral to pulmonary - may need sleep study - snores at night - reports O2 sats are low in the mornings   Follow up:  Follow up in 3 weeks or sooner if needed - will need repeat imaging

## 2020-05-07 NOTE — Telephone Encounter (Signed)
Reviewed Post Covid Clinic OV note from yesterday. They placed pt on Prednisone and Azithromycin. She reported low O2 sats in the mornings. Referral placed to pulmonology and they also anticipate her needing a sleep study. Follow up scheduled for 3 weeks.

## 2020-05-15 NOTE — Telephone Encounter (Signed)
Coworker Jovanka spoke with pt this morning as she has been on a daily basis during quarantine at pt's request due to language barrier. Pt returned to work on Monday 05/11/20. Pt feeling very well. No fatigue or other sx since late last week. Asked coworker to remind pt to return pulse oximeter to clinic. Coworker will do that and bring to clinic next week. No further needs.

## 2020-05-15 NOTE — Telephone Encounter (Signed)
Noted patient returned to work after [facility] visit and feeling well.

## 2020-05-27 ENCOUNTER — Ambulatory Visit
Admission: RE | Admit: 2020-05-27 | Discharge: 2020-05-27 | Disposition: A | Discharge status: Home / Self Care | Payer: PRIVATE HEALTH INSURANCE | Source: Ambulatory Visit | Attending: Nurse Practitioner | Admitting: Nurse Practitioner

## 2020-05-27 ENCOUNTER — Other Ambulatory Visit: Payer: Self-pay

## 2020-05-27 ENCOUNTER — Ambulatory Visit: Payer: PRIVATE HEALTH INSURANCE | Admitting: Nurse Practitioner

## 2020-05-27 VITALS — BP 142/90 | HR 77 | Temp 97.0°F | Wt 199.0 lb

## 2020-05-27 DIAGNOSIS — J1282 Pneumonia due to coronavirus disease 2019: Secondary | ICD-10-CM | POA: Diagnosis not present

## 2020-05-27 DIAGNOSIS — U071 COVID-19: Secondary | ICD-10-CM

## 2020-05-27 NOTE — Assessment & Plan Note (Signed)
Covid 19 Cough:   Stay well hydrated  Stay active  Deep breathing exercises  May take tylenol or fever or pain  May take mucinex twice daily  Will order prednisone  Will order azithromycin  Will place referral to pulmonary - may need sleep study - snores at night - reports O2 sats are low in the mornings   Follow up:  Follow up in 3 weeks or sooner if needed - will need repeat imaging  

## 2020-05-27 NOTE — Progress Notes (Signed)
@Patient  ID: Melanie Arroyo, female    DOB: Jan 16, 1956, 64 y.o.   MRN: [id number]  Chief Complaint  Patient presents with  . Follow-up    Feeling better, no questions or concerns.     Referring provider: No ref. provider found    64 year old female everyday smoker with no significant health history.   HPI  Patient presents today for post COVID care clinic visit follow-up.  Patient was diagnosed with Covid pneumonia on 04/27/2020.  She was seen here on 05/06/2020.  She was prescribed azithromycin and prednisone.  She states that she is much improved.  She does have some allergy type symptoms such as scratchy throat.  We discussed that she can start Zyrtec daily.  Patient will need repeat imaging today. Denies f/c/s, n/v/d, hemoptysis, PND, chest pain or edema.      No Known Allergies   There is no immunization history on file for this patient.  No past medical history on file.  Tobacco History: Social History   Tobacco Use  Smoking Status Current Some Day Smoker  Smokeless Tobacco Never Used   Ready to quit: Not Answered Counseling given: Not Answered   Outpatient Encounter Medications as of 05/27/2020  Medication Sig  . albuterol (VENTOLIN HFA) 108 (90 Base) MCG/ACT inhaler Inhale 1-2 puffs into the lungs every 6 (six) hours as needed for wheezing or shortness of breath (protracted coughing).  . nicotine (NICODERM CQ - DOSED IN MG/24 HOURS) 14 mg/24hr patch Place 1 patch (14 mg total) onto the skin daily.  . benzonatate (TESSALON) 100 MG capsule Take 1 capsule (100 mg total) by mouth every 8 (eight) hours. (Patient not taking: Reported on 05/27/2020)  . ondansetron (ZOFRAN ODT) 4 MG disintegrating tablet Take 1 tablet (4 mg total) by mouth every 8 (eight) hours as needed for nausea or vomiting. (Patient not taking: Reported on 05/06/2020)  . [DISCONTINUED] azithromycin (ZITHROMAX) 250 MG tablet Take 2 tablets (500 mg) on day 1, then take 1 tablet (250 mg) on days 2-5    No facility-administered encounter medications on file as of 05/27/2020.     Review of Systems  Review of Systems  Constitutional: Negative.  Negative for fatigue and fever.  HENT: Negative.   Respiratory: Negative for cough and shortness of breath.   Cardiovascular: Negative.  Negative for chest pain, palpitations and leg swelling.  Gastrointestinal: Negative.   Allergic/Immunologic: Negative.   Neurological: Negative.   Psychiatric/Behavioral: Negative.        Physical Exam  BP (!) 142/90 (BP Location: Left Arm)   Pulse 77   Temp (!) 97 F (36.1 C)   Wt 199 lb (90.3 kg)   SpO2 96%   BMI 30.51 kg/m   Wt Readings from Last 5 Encounters:  05/27/20 199 lb (90.3 kg)  05/06/20 194 lb 0.1 oz (88 kg)  04/27/20 180 lb 12.4 oz (82 kg)     Physical Exam Vitals and nursing note reviewed.  Constitutional:      General: She is not in acute distress.    Appearance: She is well-developed.  Cardiovascular:     Rate and Rhythm: Normal rate and regular rhythm.  Pulmonary:     Effort: Pulmonary effort is normal.     Breath sounds: Normal breath sounds.  Musculoskeletal:     Right lower leg: No edema.     Left lower leg: No edema.  Neurological:     Mental Status: She is alert and oriented to person, place, and time.  Imaging: DG Chest Port 1 View  Result Date: 04/28/2020 CLINICAL DATA:  Dyspnea EXAM: PORTABLE CHEST 1 VIEW COMPARISON:  10/31/2004 FINDINGS: The lungs are symmetrically well expanded. Patchy peripheral interstitial and airspace infiltrate is seen within the mid and lower lung zones, likely infectious or inflammatory in the acute setting. No pneumothorax or pleural effusion. Cardiac size within normal limits. No acute bone abnormality. IMPRESSION: Patchy multifocal pulmonary infiltrates, likely infectious or inflammatory in the acute setting. Electronically Signed   By: Helyn Numbers MD   On: 04/28/2020 03:05     Assessment & Plan:   Pneumonia due  to COVID-19 virus Covid 19 Cough:   Stay well hydrated  Stay active  Deep breathing exercises  May take tylenol or fever or pain  May take mucinex twice daily  Will order prednisone  Will order azithromycin  Will place referral to pulmonary - may need sleep study - snores at night - reports O2 sats are low in the mornings   Follow up:  Follow up in 3 weeks or sooner if needed - will need repeat imaging     Ivonne Andrew, NP 05/27/2020

## 2020-05-27 NOTE — Patient Instructions (Signed)
History of Covid 19:   Stay well hydrated  Stay active  Deep breathing exercises  May take tylenol or fever or pain  May take mucinex twice daily  May start zyrtec  Will order chest xray  Follow up:  Follow up if needed

## 2020-06-02 ENCOUNTER — Other Ambulatory Visit: Payer: Self-pay

## 2020-06-02 ENCOUNTER — Ambulatory Visit: Payer: Self-pay | Admitting: *Deleted

## 2020-06-02 ENCOUNTER — Other Ambulatory Visit: Payer: Self-pay | Admitting: Registered Nurse

## 2020-06-02 DIAGNOSIS — Z0000 Encounter for general adult medical examination without abnormal findings: Secondary | ICD-10-CM

## 2020-06-02 NOTE — Telephone Encounter (Signed)
Patient in clinic today for [facility] lab draw.

## 2020-06-02 NOTE — Progress Notes (Signed)
Pt requests fasting labs as not done in multiple years. Has upcoming appt with Thad Ranger, NP through post Covid care clinic and looking to establish primary care.

## 2020-06-03 LAB — HGB A1C W/O EAG: Hgb A1c MFr Bld: 5.9 % — ABNORMAL HIGH (ref 4.8–5.6)

## 2020-06-03 LAB — URINE CULTURE

## 2020-06-03 LAB — SPECIMEN STATUS REPORT

## 2020-06-04 LAB — CMP12+LP+TP+TSH+6AC+CBC/D/PLT
ALT: 19 IU/L (ref 0–32)
AST: 18 IU/L (ref 0–40)
Albumin/Globulin Ratio: 1.9 (ref 1.2–2.2)
Albumin: 4.3 g/dL (ref 3.8–4.8)
Alkaline Phosphatase: 101 IU/L (ref 44–121)
BUN/Creatinine Ratio: 21 (ref 12–28)
BUN: 15 mg/dL (ref 8–27)
Basophils Absolute: 0.1 10*3/uL (ref 0.0–0.2)
Basos: 2 %
Bilirubin Total: 0.4 mg/dL (ref 0.0–1.2)
Calcium: 10.1 mg/dL (ref 8.7–10.3)
Chloride: 102 mmol/L (ref 96–106)
Chol/HDL Ratio: 5.8 ratio — ABNORMAL HIGH (ref 0.0–4.4)
Cholesterol, Total: 253 mg/dL — ABNORMAL HIGH (ref 100–199)
Creatinine, Ser: 0.7 mg/dL (ref 0.57–1.00)
EOS (ABSOLUTE): 0.1 10*3/uL (ref 0.0–0.4)
Eos: 2 %
Estimated CHD Risk: 1.6 times avg. — ABNORMAL HIGH (ref 0.0–1.0)
Free Thyroxine Index: 2 (ref 1.2–4.9)
GFR calc Af Amer: 107 mL/min/{1.73_m2} (ref >59)
GFR calc non Af Amer: 93 mL/min/{1.73_m2} (ref >59)
GGT: 21 IU/L (ref 0–60)
Globulin, Total: 2.3 g/dL (ref 1.5–4.5)
Glucose: 89 mg/dL (ref 65–99)
HDL: 44 mg/dL (ref >39)
Hematocrit: 43.9 % (ref 34.0–46.6)
Hemoglobin: 13.9 g/dL (ref 11.1–15.9)
Immature Grans (Abs): 0 10*3/uL (ref 0.0–0.1)
Immature Granulocytes: 0 %
Iron: 110 ug/dL (ref 27–139)
LDH: 216 IU/L (ref 119–226)
LDL Chol Calc (NIH): 177 mg/dL — ABNORMAL HIGH (ref 0–99)
Lymphocytes Absolute: 1.8 10*3/uL (ref 0.7–3.1)
Lymphs: 29 %
MCH: 28.2 pg (ref 26.6–33.0)
MCHC: 31.7 g/dL (ref 31.5–35.7)
MCV: 89 fL (ref 79–97)
Monocytes Absolute: 0.3 10*3/uL (ref 0.1–0.9)
Monocytes: 4 %
Neutrophils Absolute: 3.9 10*3/uL (ref 1.4–7.0)
Neutrophils: 63 %
Phosphorus: 2.8 mg/dL — ABNORMAL LOW (ref 3.0–4.3)
Platelets: 254 10*3/uL (ref 150–450)
Potassium: 4.5 mmol/L (ref 3.5–5.2)
RBC: 4.93 x10E6/uL (ref 3.77–5.28)
RDW: 13.9 % (ref 11.7–15.4)
Sodium: 139 mmol/L (ref 134–144)
T3 Uptake Ratio: 23 % — ABNORMAL LOW (ref 24–39)
T4, Total: 8.5 ug/dL (ref 4.5–12.0)
TSH: 2.39 u[IU]/mL (ref 0.450–4.500)
Total Protein: 6.6 g/dL (ref 6.0–8.5)
Triglycerides: 171 mg/dL — ABNORMAL HIGH (ref 0–149)
Uric Acid: 5.5 mg/dL (ref 3.0–7.2)
VLDL Cholesterol Cal: 32 mg/dL (ref 5–40)
WBC: 6.1 10*3/uL (ref 3.4–10.8)

## 2020-06-04 LAB — SPECIMEN STATUS REPORT

## 2020-06-04 NOTE — Progress Notes (Signed)
Pt scheduled for result review in clinic this afternoon as she works 2nd shift. Urine was not collected as only A1c and Exec panel were ordered on 06/02/20. RN unsure where other orders or written authorization, that were noted by [facility], originated from.

## 2020-06-04 NOTE — Progress Notes (Signed)
Noted no urine ordered and patient scheduled to review results this afternoon prior to shift start 2nd.

## 2020-06-04 NOTE — Progress Notes (Signed)
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 Pt reports she has been told of high cholesterol in the past. Also discussed elevated A1c diet and exercise. Handouts provided.  Asked when she could receive Covid vaccination as she has relatives in [place] and sts there if you have Covid naturally, they treat the infection as the 1st dose of the vaccine and only give the 2nd.  Advised pt of current CDC/FDA recommendations of waiting 90 days after positive covid test/MAbs for first vaccine. Pt verbalizes understanding and agreement. Denies further questions/concerns.

## 2020-06-04 NOTE — Telephone Encounter (Signed)
Elevated cholesterol, Hgba1c and decreased T3 uptake and phosphorus.  Patient has PCM appt pending and RN appt to discuss lab results works 2nd shift.

## 2020-06-10 ENCOUNTER — Encounter: Payer: Self-pay | Admitting: Nurse Practitioner

## 2020-06-10 ENCOUNTER — Other Ambulatory Visit: Payer: Self-pay

## 2020-06-10 ENCOUNTER — Ambulatory Visit (INDEPENDENT_AMBULATORY_CARE_PROVIDER_SITE_OTHER): Payer: PRIVATE HEALTH INSURANCE | Admitting: Nurse Practitioner

## 2020-06-10 VITALS — BP 141/82 | HR 76 | Temp 98.8°F | Ht 67.0 in | Wt 194.0 lb

## 2020-06-10 DIAGNOSIS — E7800 Pure hypercholesterolemia, unspecified: Secondary | ICD-10-CM | POA: Diagnosis not present

## 2020-06-10 DIAGNOSIS — R030 Elevated blood-pressure reading, without diagnosis of hypertension: Secondary | ICD-10-CM | POA: Diagnosis not present

## 2020-06-10 DIAGNOSIS — R7303 Prediabetes: Secondary | ICD-10-CM | POA: Diagnosis not present

## 2020-06-10 DIAGNOSIS — F17209 Nicotine dependence, unspecified, with unspecified nicotine-induced disorders: Secondary | ICD-10-CM

## 2020-06-10 DIAGNOSIS — Z1159 Encounter for screening for other viral diseases: Secondary | ICD-10-CM

## 2020-06-10 DIAGNOSIS — Z114 Encounter for screening for human immunodeficiency virus [HIV]: Secondary | ICD-10-CM

## 2020-06-10 NOTE — Progress Notes (Signed)
[facility] Patient [facility] [address] [place], [place]  [address] Phone:  [phone]   Fax:  [phone]   New Patient Office Visit  Subjective:  Patient ID: Melanie Arroyo, female    DOB: [birth year]  Age: 64 y.o. MRN: [id number]  CC:  Chief Complaint  Patient presents with  . Establish Care    Scratchy throat since yesterday    HPI Melanie Arroyo presents to establish care. 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 She is in today accompanied by her son. She works fulltime at [employer]. She has elevated cholesterol. She has not been on any treatment.  The patient does not exercises . She admits that there is a family history of elevated cholesterol. She does not feel like she is going to be able to change many of her eating habits. She would prefer medication management.   No past medical history on file.  No past surgical history on file.  No family history on file.  Social History   Socioeconomic History  . Marital status: Married    Spouse name: Not on file  . Number of children: Not on file  . Years of education: Not on file  . Highest education level: Not on file  Occupational History  . Not on file  Tobacco Use  . Smoking status: Current Some Day Smoker  . Smokeless tobacco: Never Used  Substance and Sexual Activity  . Alcohol use: Not Currently  . Drug use: Never  . Sexual activity: Not on file  Other Topics Concern  . Not on file  Social History Narrative  . Not on file   Social Determinants of Health   Financial Resource Strain: Not on file  Food Insecurity: Not on file  Transportation Needs: Not on file  Physical Activity: Not on file  Stress: Not on file  Social Connections: Not on file  Intimate Partner Violence: Not on file    ROS Review of Systems  Constitutional: Negative.   HENT: Positive for sneezing.   Respiratory: Positive for cough.   Cardiovascular: Negative for chest pain.       Pressure with with sitting due to  obese abdomen  Gastrointestinal: Negative.     Objective:   Today's Vitals: BP (!) 141/82   Pulse 76   Temp 98.8 F (37.1 C) (Temporal)   Ht 5\' 7"  (1.702 m)   Wt 194 lb (88 kg)   SpO2 99%   BMI 30.38 kg/m   Physical Exam Constitutional:      General: She is not in acute distress.    Appearance: She is obese. She is not ill-appearing, toxic-appearing or diaphoretic.  HENT:     Head: Normocephalic and atraumatic.  Cardiovascular:     Rate and Rhythm: Normal rate and regular rhythm.     Pulses: Normal pulses.     Heart sounds: Normal heart sounds.  Pulmonary:     Effort: Pulmonary effort is normal.     Comments: diminished BS Abdominal:     General: Bowel sounds are normal.     Palpations: Abdomen is soft.  Musculoskeletal:        General: Normal range of motion.     Cervical back: Normal range of motion.  Skin:    General: Skin is warm and dry.     Capillary Refill: Capillary refill takes less than 2 seconds.  Neurological:     General: No focal deficit present.     Mental Status: She is alert  and oriented to person, place, and time.  Psychiatric:        Mood and Affect: Mood normal.        Behavior: Behavior normal.        Thought Content: Thought content normal.        Judgment: Judgment normal.     Assessment & Plan:   Problem List Items Addressed This Visit      Other   Elevated BP without diagnosis of hypertension   Relevant Orders   Comp. Metabolic Panel (12)   Elevated cholesterol   1. Encouraged dietary measures. 2. Continue regular exercise. 3. Lipid-lowering medications: Rouvastatin  4. Follow up in 3 months.      Relevant Medications   rosuvastatin (CRESTOR) 5 MG tablet   Prediabetes - Primary Consider home glucose monitoring Weight loss at least 5% of current body weight is can be achieved with lifestyle modification dietary changes and regular daily exercise Encourage blood pressure control goal <120/80 and maintaining total cholesterol  <200 Follow-up every 3 to 6 months for reevaluation     Other Visit Diagnoses    Encounter for hepatitis C screening test for low risk patient       Relevant Orders   Hepatitis C antibody   Screening for HIV (human immunodeficiency virus)       Relevant Orders   HIV Antibody (routine testing w rflx)      Outpatient Encounter Medications as of 06/10/2020  Medication Sig  . albuterol (VENTOLIN HFA) 108 (90 Base) MCG/ACT inhaler Inhale 1-2 puffs into the lungs every 6 (six) hours as needed for wheezing or shortness of breath (protracted coughing).  . benzonatate (TESSALON) 100 MG capsule Take 1 capsule (100 mg total) by mouth every 8 (eight) hours. (Patient not taking: Reported on 05/27/2020)  . nicotine (NICODERM CQ - DOSED IN MG/24 HOURS) 14 mg/24hr patch Place 1 patch (14 mg total) onto the skin daily.  . ondansetron (ZOFRAN ODT) 4 MG disintegrating tablet Take 1 tablet (4 mg total) by mouth every 8 (eight) hours as needed for nausea or vomiting. (Patient not taking: Reported on 05/06/2020)  . rosuvastatin (CRESTOR) 5 MG tablet Take 1 tablet (5 mg total) by mouth daily.   No facility-administered encounter medications on file as of 06/10/2020.    Follow-up: Return in about 3 months (around 09/08/2020) for fasting labs with well woman physcial.   Barbette Merino, NP

## 2020-06-10 NOTE — Patient Instructions (Signed)
Prediabetes Eating Plan Prediabetes is a condition that causes blood sugar (glucose) levels to be higher than normal. This increases the risk for developing diabetes. In order to prevent diabetes from developing, your health care provider may recommend a diet and other lifestyle changes to help you:  Control your blood glucose levels.  Improve your cholesterol levels.  Manage your blood pressure. Your health care provider may recommend working with a diet and nutrition specialist (dietitian) to make a meal plan that is best for you. What are tips for following this plan? Lifestyle  Set weight loss goals with the help of your health care team. It is recommended that most people with prediabetes lose 7% of their current body weight.  Exercise for at least 30 minutes at least 5 days a week.  Attend a support group or seek ongoing support from a mental health counselor.  Take over-the-counter and prescription medicines only as told by your health care provider. Reading food labels  Read food labels to check the amount of fat, salt (sodium), and sugar in prepackaged foods. Avoid foods that have: ? Saturated fats. ? Trans fats. ? Added sugars.  Avoid foods that have more than 300 milligrams (mg) of sodium per serving. Limit your daily sodium intake to less than 2,300 mg each day. Shopping  Avoid buying pre-made and processed foods. Cooking  Cook with olive oil. Do not use butter, lard, or ghee.  Bake, broil, grill, or boil foods. Avoid frying. Meal planning   Work with your dietitian to develop an eating plan that is right for you. This may include: ? Tracking how many calories you take in. Use a food diary, notebook, or mobile application to track what you eat at each meal. ? Using the glycemic index (GI) to plan your meals. The index tells you how quickly a food will raise your blood glucose. Choose low-GI foods. These foods take a longer time to raise blood glucose.  Consider  following a Mediterranean diet. This diet includes: ? Several servings each day of fresh fruits and vegetables. ? Eating fish at least twice a week. ? Several servings each day of whole grains, beans, nuts, and seeds. ? Using olive oil instead of other fats. ? Moderate alcohol consumption. ? Eating small amounts of red meat and whole-fat dairy.  If you have high blood pressure, you may need to limit your sodium intake or follow a diet such as the DASH eating plan. DASH is an eating plan that aims to lower high blood pressure. What foods are recommended? The items listed below may not be a complete list. Talk with your dietitian about what dietary choices are best for you. Grains Whole grains, such as whole-wheat or whole-grain breads, crackers, cereals, and pasta. Unsweetened oatmeal. Bulgur. Barley. Quinoa. Brown rice. Corn or whole-wheat flour tortillas or taco shells. Vegetables Lettuce. Spinach. Peas. Beets. Cauliflower. Cabbage. Broccoli. Carrots. Tomatoes. Squash. Eggplant. Herbs. Peppers. Onions. Cucumbers. Brussels sprouts. Fruits Berries. Bananas. Apples. Oranges. Grapes. Papaya. Mango. Pomegranate. Kiwi. Grapefruit. Cherries. Meats and other protein foods Seafood. Poultry without skin. Lean cuts of pork and beef. Tofu. Eggs. Nuts. Beans. Dairy Low-fat or fat-free dairy products, such as yogurt, cottage cheese, and cheese. Beverages Water. Tea. Coffee. Sugar-free or diet soda. Seltzer water. Lowfat or no-fat milk. Milk alternatives, such as soy or almond milk. Fats and oils Olive oil. Canola oil. Sunflower oil. Grapeseed oil. Avocado. Walnuts. Sweets and desserts Sugar-free or low-fat pudding. Sugar-free or low-fat ice cream and other frozen treats.   Seasoning and other foods Herbs. Sodium-free spices. Mustard. Relish. Low-fat, low-sugar ketchup. Low-fat, low-sugar barbecue sauce. Low-fat or fat-free mayonnaise. What foods are not recommended? The items listed below may not be a  complete list. Talk with your dietitian about what dietary choices are best for you. Grains Refined white flour and flour products, such as bread, pasta, snack foods, and cereals. Vegetables Canned vegetables. Frozen vegetables with butter or cream sauce. Fruits Fruits canned with syrup. Meats and other protein foods Fatty cuts of meat. Poultry with skin. Breaded or fried meat. Processed meats. Dairy Full-fat yogurt, cheese, or milk. Beverages Sweetened drinks, such as sweet iced tea and soda. Fats and oils Butter. Lard. Ghee. Sweets and desserts Baked goods, such as cake, cupcakes, pastries, cookies, and cheesecake. Seasoning and other foods Spice mixes with added salt. Ketchup. Barbecue sauce. Mayonnaise. Summary  To prevent diabetes from developing, you may need to make diet and other lifestyle changes to help control blood sugar, improve cholesterol levels, and manage your blood pressure.  Set weight loss goals with the help of your health care team. It is recommended that most people with prediabetes lose 7 percent of their current body weight.  Consider following a Mediterranean diet that includes plenty of fresh fruits and vegetables, whole grains, beans, nuts, seeds, fish, lean meat, low-fat dairy, and healthy oils. This information is not intended to replace advice given to you by your health care provider. Make sure you discuss any questions you have with your health care provider. Document Revised: 10/05/2018 Document Reviewed: 08/17/2016 Elsevier Patient Education  2020 Elsevier Inc. High Cholesterol  High cholesterol is a condition in which the blood has high levels of a white, waxy, fat-like substance (cholesterol). The human body needs small amounts of cholesterol. The liver makes all the cholesterol that the body needs. Extra (excess) cholesterol comes from the food that we eat. Cholesterol is carried from the liver by the blood through the blood vessels. If you have  high cholesterol, deposits (plaques) may build up on the walls of your blood vessels (arteries). Plaques make the arteries narrower and stiffer. Cholesterol plaques increase your risk for heart attack and stroke. Work with your health care provider to keep your cholesterol levels in a healthy range. What increases the risk? This condition is more likely to develop in people who:  Eat foods that are high in animal fat (saturated fat) or cholesterol.  Are overweight.  Are not getting enough exercise.  Have a family history of high cholesterol. What are the signs or symptoms? There are no symptoms of this condition. How is this diagnosed? This condition may be diagnosed from the results of a blood test.  If you are older than age 20, your health care provider may check your cholesterol every 4-6 years.  You may be checked more often if you already have high cholesterol or other risk factors for heart disease. The blood test for cholesterol measures:  "Bad" cholesterol (LDL cholesterol). This is the main type of cholesterol that causes heart disease. The desired level for LDL is less than 100.  "Good" cholesterol (HDL cholesterol). This type helps to protect against heart disease by cleaning the arteries and carrying the LDL away. The desired level for HDL is 60 or higher.  Triglycerides. These are fats that the body can store or burn for energy. The desired number for triglycerides is lower than 150.  Total cholesterol. This is a measure of the total amount of cholesterol in your blood, including   LDL cholesterol, HDL cholesterol, and triglycerides. A healthy number is less than 200. How is this treated? This condition is treated with diet changes, lifestyle changes, and medicines. Diet changes  This may include eating more whole grains, fruits, vegetables, nuts, and fish.  This may also include cutting back on red meat and foods that have a lot of added sugar. Lifestyle  changes  Changes may include getting at least 40 minutes of aerobic exercise 3 times a week. Aerobic exercises include walking, biking, and swimming. Aerobic exercise along with a healthy diet can help you maintain a healthy weight.  Changes may also include quitting smoking. Medicines  Medicines are usually given if diet and lifestyle changes have failed to reduce your cholesterol to healthy levels.  Your health care provider may prescribe a statin medicine. Statin medicines have been shown to reduce cholesterol, which can reduce the risk of heart disease. Follow these instructions at home: Eating and drinking If told by your health care provider:  Eat chicken (without skin), fish, veal, shellfish, ground turkey breast, and round or loin cuts of red meat.  Do not eat fried foods or fatty meats, such as hot dogs and salami.  Eat plenty of fruits, such as apples.  Eat plenty of vegetables, such as broccoli, potatoes, and carrots.  Eat beans, peas, and lentils.  Eat grains such as barley, rice, couscous, and bulgur wheat.  Eat pasta without cream sauces.  Use skim or nonfat milk, and eat low-fat or nonfat yogurt and cheeses.  Do not eat or drink whole milk, cream, ice cream, egg yolks, or hard cheeses.  Do not eat stick margarine or tub margarines that contain trans fats (also called partially hydrogenated oils).  Do not eat saturated tropical oils, such as coconut oil and palm oil.  Do not eat cakes, cookies, crackers, or other baked goods that contain trans fats.  General instructions  Exercise as directed by your health care provider. Increase your activity level with activities such as gardening, walking, and taking the stairs.  Take over-the-counter and prescription medicines only as told by your health care provider.  Do not use any products that contain nicotine or tobacco, such as cigarettes and e-cigarettes. If you need help quitting, ask your health care  provider.  Keep all follow-up visits as told by your health care provider. This is important. Contact a health care provider if:  You are struggling to maintain a healthy diet or weight.  You need help to start on an exercise program.  You need help to stop smoking. Get help right away if:  You have chest pain.  You have trouble breathing. This information is not intended to replace advice given to you by your health care provider. Make sure you discuss any questions you have with your health care provider. Document Revised: 06/16/2017 Document Reviewed: 12/12/2015 Elsevier Patient Education  2020 Elsevier Inc.  

## 2020-06-11 MED ORDER — ROSUVASTATIN CALCIUM 5 MG PO TABS: 5.0000 mg | ORAL_TABLET | Freq: Every day | ORAL | 3 refills | Status: DC

## 2020-06-13 ENCOUNTER — Encounter: Payer: Self-pay | Admitting: Nurse Practitioner

## 2020-09-09 ENCOUNTER — Encounter: Payer: Self-pay | Admitting: Nurse Practitioner

## 2020-12-23 ENCOUNTER — Other Ambulatory Visit: Payer: Self-pay

## 2020-12-23 ENCOUNTER — Other Ambulatory Visit: Payer: Self-pay | Admitting: Nurse Practitioner

## 2020-12-23 ENCOUNTER — Encounter: Payer: Self-pay | Admitting: Nurse Practitioner

## 2020-12-23 ENCOUNTER — Ambulatory Visit: Payer: No Typology Code available for payment source | Admitting: Nurse Practitioner

## 2020-12-23 VITALS — BP 130/85 | HR 90 | Temp 97.0°F | Ht 67.0 in | Wt 196.0 lb

## 2020-12-23 DIAGNOSIS — Z0000 Encounter for general adult medical examination without abnormal findings: Secondary | ICD-10-CM | POA: Diagnosis not present

## 2020-12-23 DIAGNOSIS — Z01419 Encounter for gynecological examination (general) (routine) without abnormal findings: Secondary | ICD-10-CM

## 2020-12-23 DIAGNOSIS — E7800 Pure hypercholesterolemia, unspecified: Secondary | ICD-10-CM | POA: Diagnosis not present

## 2020-12-23 DIAGNOSIS — R82998 Other abnormal findings in urine: Secondary | ICD-10-CM

## 2020-12-23 LAB — POCT URINALYSIS DIPSTICK
Bilirubin, UA: NEGATIVE
Glucose, UA: NEGATIVE
Ketones, UA: NEGATIVE
Nitrite, UA: NEGATIVE
Protein, UA: NEGATIVE
Spec Grav, UA: 1.01 (ref 1.010–1.025)
Urobilinogen, UA: 0.2 E.U./dL
pH, UA: 5.5 (ref 5.0–8.0)

## 2020-12-23 NOTE — Progress Notes (Signed)
[facility] Patient [facility] [address] [place], [place]  [address] Phone:  [phone]   Fax:  [phone]   Established Patient Office Visit  Subjective:  Patient ID: Melanie Arroyo, female    DOB: March 30, 1956  Age: 65 y.o. MRN: [id number]  CC:  Chief Complaint  Patient presents with   Annual Exam    Soreness and ache in joints, thinks its arthritis related.     HPI Melanie Arroyo presents for annual exam. She is also having joint pains mainly in her wrist. She does work full time at [employer].   Past Medical History:  Diagnosis Date   Hyperlipidemia     History reviewed. No pertinent surgical history.  History reviewed. No pertinent family history.  Social History   Socioeconomic History   Marital status: Married    Spouse name: Not on file   Number of children: Not on file   Years of education: Not on file   Highest education level: Not on file  Occupational History   Not on file  Tobacco Use   Smoking status: Every Day    Packs/day: 1.00    Pack years: 0.00    Types: Cigarettes   Smokeless tobacco: Never  Substance and Sexual Activity   Alcohol use: Not Currently   Drug use: Never   Sexual activity: Not on file  Other Topics Concern   Not on file  Social History Narrative   Not on file   Social Determinants of Health   Financial Resource Strain: Not on file  Food Insecurity: Not on file  Transportation Needs: Not on file  Physical Activity: Not on file  Stress: Not on file  Social Connections: Not on file  Intimate Partner Violence: Not on file    Outpatient Medications Prior to Visit  Medication Sig Dispense Refill   rosuvastatin (CRESTOR) 5 MG tablet Take 1 tablet (5 mg total) by mouth daily. 90 tablet 3   albuterol (VENTOLIN HFA) 108 (90 Base) MCG/ACT inhaler Inhale 1-2 puffs into the lungs every 6 (six) hours as needed for wheezing or shortness of breath (protracted coughing). 8 g 0   nicotine (NICODERM CQ - DOSED IN  MG/24 HOURS) 14 mg/24hr patch Place 1 patch (14 mg total) onto the skin daily. (Patient not taking: Reported on 12/23/2020) 28 patch 0   benzonatate (TESSALON) 100 MG capsule Take 1 capsule (100 mg total) by mouth every 8 (eight) hours. (Patient not taking: Reported on 05/27/2020) 21 capsule 0   ondansetron (ZOFRAN ODT) 4 MG disintegrating tablet Take 1 tablet (4 mg total) by mouth every 8 (eight) hours as needed for nausea or vomiting. (Patient not taking: Reported on 05/06/2020) 20 tablet 0   No facility-administered medications prior to visit.    No Known Allergies  ROS Review of Systems    Objective:    Physical Exam Exam conducted with a chaperone present.  Constitutional:      Appearance: She is obese.  HENT:     Head: Normocephalic and atraumatic.  Cardiovascular:     Rate and Rhythm: Normal rate.  Pulmonary:     Effort: Pulmonary effort is normal.  Chest:     Chest wall: No mass.  Breasts:    Right: Normal. No axillary adenopathy or supraclavicular adenopathy.     Left: Normal. No axillary adenopathy or supraclavicular adenopathy.  Musculoskeletal:        General: Normal range of motion.  Lymphadenopathy:     Upper Body:  Right upper body: No supraclavicular, axillary or pectoral adenopathy.     Left upper body: No supraclavicular, axillary or pectoral adenopathy.  Skin:    General: Skin is warm and dry.     Capillary Refill: Capillary refill takes less than 2 seconds.  Neurological:     General: No focal deficit present.     Mental Status: She is alert and oriented to person, place, and time.  Psychiatric:        Mood and Affect: Mood normal.        Behavior: Behavior normal.        Thought Content: Thought content normal.        Judgment: Judgment normal.    BP 130/85   Pulse 90   Temp (!) 97 F (36.1 C)   Ht 5\' 7"  (1.702 m)   Wt 196 lb 0.2 oz (88.9 kg)   SpO2 97%   BMI 30.70 kg/m  Wt Readings from Last 3 Encounters:  12/23/20 196 lb 0.2 oz (88.9  kg)  06/10/20 194 lb (88 kg)  05/27/20 199 lb (90.3 kg)     There are no preventive care reminders to display for this patient.   There are no preventive care reminders to display for this patient.  Lab Results  Component Value Date   TSH 2.390 06/02/2020   Lab Results  Component Value Date   WBC 6.1 06/02/2020   HGB 13.9 06/02/2020   HCT 43.9 06/02/2020   MCV 89 06/02/2020   PLT 254 06/02/2020   Lab Results  Component Value Date   NA 139 06/02/2020   K 4.5 06/02/2020   CO2 26 04/27/2020   GLUCOSE 89 06/02/2020   BUN 15 06/02/2020   CREATININE 0.70 06/02/2020   BILITOT 0.4 06/02/2020   ALKPHOS 101 06/02/2020   AST 18 06/02/2020   ALT 19 06/02/2020   PROT 6.6 06/02/2020   ALBUMIN 4.3 06/02/2020   CALCIUM 10.1 06/02/2020   ANIONGAP 13 04/27/2020   Lab Results  Component Value Date   CHOL 253 (H) 06/02/2020   Lab Results  Component Value Date   HDL 44 06/02/2020   Lab Results  Component Value Date   LDLCALC 177 (H) 06/02/2020   Lab Results  Component Value Date   TRIG 171 (H) 06/02/2020   Lab Results  Component Value Date   CHOLHDL 5.8 (H) 06/02/2020   Lab Results  Component Value Date   HGBA1C 5.9 (H) 06/02/2020      Assessment & Plan:   Problem List Items Addressed This Visit       Other   Elevated cholesterol Encouraged healthy diet low in fat and cholesterol and daily activity   Relevant Orders   Lipid panel   Other Visit Diagnoses     Annual physical exam    -  Primary Discussed female health maintenance; SBE, annual CBE, PAP test completed Discussed general safety in vehicle and COVID Discussed regular hydration with water Discussed healthy diet and exercise and weight management Discussed mental health Encouraged to call our office for an appointment with in ongoing concerns for questions.     Relevant Orders   Urinalysis Dipstick (Completed)   Comp. Metabolic Panel (12)   Encntr for gyn exam (general) (routine) w/o abn  findings       Relevant Orders   IGP,rfx Apt HPV ASCU,16/18,45   Well woman exam with routine gynecological exam       Urine white blood cells increased  Relevant Orders   Urine Culture       No orders of the defined types were placed in this encounter.   Follow-up: Return in about 1 year (around 12/23/2021) for Physical, PREVENTIVE VISIT,EST,65 & OVER [[id number]].    Barbette Merino, NP

## 2020-12-23 NOTE — Patient Instructions (Signed)
Health Maintenance, Female Adopting a healthy lifestyle and getting preventive care are important in promoting health and wellness. Ask your health care provider about: The right schedule for you to have regular tests and exams. Things you can do on your own to prevent diseases and keep yourself healthy. What should I know about diet, weight, and exercise? Eat a healthy diet  Eat a diet that includes plenty of vegetables, fruits, low-fat dairy products, and lean protein. Do not eat a lot of foods that are high in solid fats, added sugars, or sodium.  Maintain a healthy weight Body mass index (BMI) is used to identify weight problems. It estimates body fat based on height and weight. Your health care provider can help determineyour BMI and help you achieve or maintain a healthy weight. Get regular exercise Get regular exercise. This is one of the most important things you can do for your health. Most adults should: Exercise for at least 150 minutes each week. The exercise should increase your heart rate and make you sweat (moderate-intensity exercise). Do strengthening exercises at least twice a week. This is in addition to the moderate-intensity exercise. Spend less time sitting. Even light physical activity can be beneficial. Watch cholesterol and blood lipids Have your blood tested for lipids and cholesterol at 65 years of age, then havethis test every 5 years. Have your cholesterol levels checked more often if: Your lipid or cholesterol levels are high. You are older than 65 years of age. You are at high risk for heart disease. What should I know about cancer screening? Depending on your health history and family history, you may need to have cancer screening at various ages. This may include screening for: Breast cancer. Cervical cancer. Colorectal cancer. Skin cancer. Lung cancer. What should I know about heart disease, diabetes, and high blood pressure? Blood pressure and heart  disease High blood pressure causes heart disease and increases the risk of stroke. This is more likely to develop in people who have high blood pressure readings, are of African descent, or are overweight. Have your blood pressure checked: Every 3-5 years if you are 18-39 years of age. Every year if you are 40 years old or older. Diabetes Have regular diabetes screenings. This checks your fasting blood sugar level. Have the screening done: Once every three years after age 40 if you are at a normal weight and have a low risk for diabetes. More often and at a younger age if you are overweight or have a high risk for diabetes. What should I know about preventing infection? Hepatitis B If you have a higher risk for hepatitis B, you should be screened for this virus. Talk with your health care provider to find out if you are at risk forhepatitis B infection. Hepatitis C Testing is recommended for: Everyone born from 1945 through 1965. Anyone with known risk factors for hepatitis C. Sexually transmitted infections (STIs) Get screened for STIs, including gonorrhea and chlamydia, if: You are sexually active and are younger than 65 years of age. You are older than 65 years of age and your health care provider tells you that you are at risk for this type of infection. Your sexual activity has changed since you were last screened, and you are at increased risk for chlamydia or gonorrhea. Ask your health care provider if you are at risk. Ask your health care provider about whether you are at high risk for HIV. Your health care provider may recommend a prescription medicine to help   prevent HIV infection. If you choose to take medicine to prevent HIV, you should first get tested for HIV. You should then be tested every 3 months for as long as you are taking the medicine. Pregnancy If you are about to stop having your period (premenopausal) and you may become pregnant, seek counseling before you get  pregnant. Take 400 to 800 micrograms (mcg) of folic acid every day if you become pregnant. Ask for birth control (contraception) if you want to prevent pregnancy. Osteoporosis and menopause Osteoporosis is a disease in which the bones lose minerals and strength with aging. This can result in bone fractures. If you are 65 years old or older, or if you are at risk for osteoporosis and fractures, ask your health care provider if you should: Be screened for bone loss. Take a calcium or vitamin D supplement to lower your risk of fractures. Be given hormone replacement therapy (HRT) to treat symptoms of menopause. Follow these instructions at home: Lifestyle Do not use any products that contain nicotine or tobacco, such as cigarettes, e-cigarettes, and chewing tobacco. If you need help quitting, ask your health care provider. Do not use street drugs. Do not share needles. Ask your health care provider for help if you need support or information about quitting drugs. Alcohol use Do not drink alcohol if: Your health care provider tells you not to drink. You are pregnant, may be pregnant, or are planning to become pregnant. If you drink alcohol: Limit how much you use to 0-1 drink a day. Limit intake if you are breastfeeding. Be aware of how much alcohol is in your drink. In the [place] one drink equals one 12 oz bottle of beer (355 mL), one 5 oz glass of wine (148 mL), or one 1 oz glass of hard liquor (44 mL). General instructions Schedule regular health, dental, and eye exams. Stay current with your vaccines. Tell your health care provider if: You often feel depressed. You have ever been abused or do not feel safe at home. Summary Adopting a healthy lifestyle and getting preventive care are important in promoting health and wellness. Follow your health care provider's instructions about healthy diet, exercising, and getting tested or screened for diseases. Follow your health care provider's  instructions on monitoring your cholesterol and blood pressure. This information is not intended to replace advice given to you by your health care provider. Make sure you discuss any questions you have with your healthcare provider. Document Revised: 06/06/2018 Document Reviewed: 06/06/2018 Elsevier Patient Education  2022 Elsevier Inc.  Breast Self-Awareness Breast self-awareness is knowing how your breasts look and feel. Doing breast self-awareness is important. It allows you to catch a breast problem early while it is still small and can be treated. All women should do breast self-awareness, including women who have had breast implants. Tell your doctorif you notice a change in your breasts. What you need: A mirror. A well-lit room. How to do a breast self-exam A breast self-exam is one way to learn what is normal for your breasts and tocheck for changes. To do a breast self-exam: Look for changes  Take off all the clothes above your waist. Stand in front of a mirror in a room with good lighting. Put your hands on your hips. Push your hands down. Look at your breasts and nipples in the mirror to see if one breast or nipple looks different from the other. Check to see if: The shape of one breast is different. The size of   one breast is different. There are wrinkles, dips, and bumps in one breast and not the other. Look at each breast for changes in the skin, such as: Redness. Scaly areas. Look for changes in your nipples, such as: Liquid around the nipples. Bleeding. Dimpling. Redness. A change in where the nipples are.  Feel for changes  Lie on your back on the floor. Feel each breast. To do this, follow these steps: Pick a breast to feel. Put the arm closest to that breast above your head. Use your other arm to feel the nipple area of your breast. Feel the area with the pads of your three middle fingers by making small circles with your fingers. For the first circle, press  lightly. For the second circle, press harder. For the third circle, press even harder. Keep making circles with your fingers at the different pressures as you move down your breast. Stop when you feel your ribs. Move your fingers a little toward the center of your body. Start making circles with your fingers again, this time going up until you reach your collarbone. Keep making up-and-down circles until you reach your armpit. Remember to keep using the three pressures. Feel the other breast in the same way. Sit or stand in the tub or shower. With soapy water on your skin, feel each breast the same way you did in step 2 when you were lying on the floor.  Write down what you find Writing down what you find can help you remember what to tell your doctor. Write down: What is normal for each breast. Any changes you find in each breast, including: The kind of changes you find. Whether you have pain. Size and location of any lumps. When you last had your menstrual period. General tips Check your breasts every month. If you are breastfeeding, the best time to check your breasts is after you feed your baby or after you use a breast pump. If you get menstrual periods, the best time to check your breasts is 5-7 days after your menstrual period is over. With time, you will become comfortable with the self-exam, and you will begin to know if there are changes in your breasts. Contact a doctor if you: See a change in the shape or size of your breasts or nipples. See a change in the skin of your breast or nipples, such as red or scaly skin. Have fluid coming from your nipples that is not normal. Find a lump or thick area that was not there before. Have pain in your breasts. Have any concerns about your breast health. Summary Breast self-awareness includes looking for changes in your breasts, as well as feeling for changes within your breasts. Breast self-awareness should be done in front of a mirror  in a well-lit room. You should check your breasts every month. If you get menstrual periods, the best time to check your breasts is 5-7 days after your menstrual period is over. Let your doctor know of any changes you see in your breasts, including changes in size, changes on the skin, pain or tenderness, or fluid from your nipples that is not normal. This information is not intended to replace advice given to you by your health care provider. Make sure you discuss any questions you have with your healthcare provider. Document Revised: 01/30/2018 Document Reviewed: 01/30/2018 Elsevier Patient Education  2022 Elsevier Inc.  

## 2020-12-24 LAB — CMP12+LP+TP+TSH+6AC+CBC/D/PLT
ALT: 19 IU/L (ref 0–32)
AST: 19 IU/L (ref 0–40)
Albumin/Globulin Ratio: 2 (ref 1.2–2.2)
Albumin: 4.6 g/dL (ref 3.8–4.8)
Alkaline Phosphatase: 114 IU/L (ref 44–121)
BUN/Creatinine Ratio: 14 (ref 12–28)
BUN: 10 mg/dL (ref 8–27)
Basophils Absolute: 0.1 10*3/uL (ref 0.0–0.2)
Basos: 1 %
Bilirubin Total: 0.4 mg/dL (ref 0.0–1.2)
Calcium: 10.6 mg/dL — ABNORMAL HIGH (ref 8.7–10.3)
Chloride: 105 mmol/L (ref 96–106)
Chol/HDL Ratio: 4.4 ratio (ref 0.0–4.4)
Cholesterol, Total: 195 mg/dL (ref 100–199)
Creatinine, Ser: 0.7 mg/dL (ref 0.57–1.00)
EOS (ABSOLUTE): 0.1 10*3/uL (ref 0.0–0.4)
Eos: 1 %
Estimated CHD Risk: 1.1 times avg. — ABNORMAL HIGH (ref 0.0–1.0)
Free Thyroxine Index: 2.2 (ref 1.2–4.9)
GGT: 13 IU/L (ref 0–60)
Globulin, Total: 2.3 g/dL (ref 1.5–4.5)
Glucose: 111 mg/dL — ABNORMAL HIGH (ref 65–99)
HDL: 44 mg/dL (ref >39)
Hematocrit: 46.5 % (ref 34.0–46.6)
Hemoglobin: 15.2 g/dL (ref 11.1–15.9)
Immature Grans (Abs): 0 10*3/uL (ref 0.0–0.1)
Immature Granulocytes: 0 %
Iron: 66 ug/dL (ref 27–139)
LDH: 191 IU/L (ref 119–226)
LDL Chol Calc (NIH): 114 mg/dL — ABNORMAL HIGH (ref 0–99)
Lymphocytes Absolute: 1.9 10*3/uL (ref 0.7–3.1)
Lymphs: 28 %
MCH: 28.2 pg (ref 26.6–33.0)
MCHC: 32.7 g/dL (ref 31.5–35.7)
MCV: 86 fL (ref 79–97)
Monocytes Absolute: 0.3 10*3/uL (ref 0.1–0.9)
Monocytes: 5 %
Neutrophils Absolute: 4.3 10*3/uL (ref 1.4–7.0)
Neutrophils: 65 %
Phosphorus: 2.7 mg/dL — ABNORMAL LOW (ref 3.0–4.3)
Platelets: 202 10*3/uL (ref 150–450)
Potassium: 4.4 mmol/L (ref 3.5–5.2)
RBC: 5.39 x10E6/uL — ABNORMAL HIGH (ref 3.77–5.28)
RDW: 13.2 % (ref 11.7–15.4)
Sodium: 142 mmol/L (ref 134–144)
T3 Uptake Ratio: 24 % (ref 24–39)
T4, Total: 9 ug/dL (ref 4.5–12.0)
TSH: 2.02 u[IU]/mL (ref 0.450–4.500)
Total Protein: 6.9 g/dL (ref 6.0–8.5)
Triglycerides: 211 mg/dL — ABNORMAL HIGH (ref 0–149)
Uric Acid: 4.7 mg/dL (ref 3.0–7.2)
VLDL Cholesterol Cal: 37 mg/dL (ref 5–40)
WBC: 6.6 10*3/uL (ref 3.4–10.8)
eGFR: 97 mL/min/{1.73_m2} (ref >59)

## 2020-12-24 LAB — HGB A1C W/O EAG: Hgb A1c MFr Bld: 5.8 % — ABNORMAL HIGH (ref 4.8–5.6)

## 2020-12-25 LAB — URINE CULTURE

## 2020-12-26 LAB — IGP,RFX APT HPV ASCU,16/18,45

## 2020-12-31 ENCOUNTER — Telehealth: Payer: Self-pay | Admitting: Registered Nurse

## 2020-12-31 ENCOUNTER — Other Ambulatory Visit: Payer: Self-pay | Admitting: Registered Nurse

## 2020-12-31 DIAGNOSIS — R7303 Prediabetes: Secondary | ICD-10-CM

## 2020-12-31 NOTE — Progress Notes (Signed)
Reviewed Be Well results with patient in clinic today.  Stated she has not been following prediabetes diet but has been taking her cholesterol medication every day.  Still smoking.  Has noticed some palpitations thought it was from her smoking.  Discussed signs and symptoms of hypercalcemia with patient and given exitcare handouts on prediabetes, prediabetes diet, cholesterol, high fiber diet, dash diet, hypercalcemia, hypophosphatemia (printed).  Patient to have 4 servings or less of dairy per day and repeat nonfasting calcium in 1 month.  Just had appt with [facility] 12/21/20.  Will follow up in 3 months for repeat Hgba1c does not want to see dietitian at this time but improve her eating habits.  Lunch typically sandwich with cheese.  Patient likes bread/carbohydrates.  Patient with history of stroke in family and will avoid dehydration/follow up with PCM for elevated RBCs.  See results note for further information/  Patient will provide clean catch urine sample tomorrow for [facility] urine culture at [facility] and will send to [facility].  Patient given copy of Be Well test results and instructions printed.  Patient had no further questions or concerns at this time.

## 2020-12-31 NOTE — Telephone Encounter (Signed)
See results note dated today

## 2021-01-01 ENCOUNTER — Other Ambulatory Visit: Payer: Self-pay | Admitting: *Deleted

## 2021-01-01 ENCOUNTER — Other Ambulatory Visit: Payer: Self-pay

## 2021-01-01 DIAGNOSIS — R7303 Prediabetes: Secondary | ICD-10-CM

## 2021-01-01 NOTE — Progress Notes (Signed)
Urine culture collection

## 2021-01-03 LAB — URINE CULTURE

## 2021-02-01 ENCOUNTER — Other Ambulatory Visit: Payer: Self-pay

## 2021-02-01 ENCOUNTER — Other Ambulatory Visit: Payer: Self-pay | Admitting: *Deleted

## 2021-02-01 DIAGNOSIS — R7303 Prediabetes: Secondary | ICD-10-CM

## 2021-02-01 NOTE — Progress Notes (Signed)
Repeat calcium and A1c

## 2021-02-02 LAB — HEMOGLOBIN A1C
Est. average glucose Bld gHb Est-mCnc: 120 mg/dL
Hgb A1c MFr Bld: 5.8 % — ABNORMAL HIGH (ref 4.8–5.6)

## 2021-02-02 LAB — CALCIUM: Calcium: 10.4 mg/dL — ABNORMAL HIGH (ref 8.7–10.3)

## 2021-02-02 NOTE — Addendum Note (Signed)
Addended by: Albina Billet A on: 02/02/2021 12:53 PM   Modules accepted: Orders

## 2021-03-04 ENCOUNTER — Other Ambulatory Visit: Payer: Self-pay

## 2021-03-04 ENCOUNTER — Ambulatory Visit: Payer: Self-pay | Admitting: *Deleted

## 2021-03-04 NOTE — Progress Notes (Signed)
Repeat calcium level per NP Tina orders.

## 2021-03-05 LAB — CALCIUM: Calcium: 10.2 mg/dL (ref 8.7–10.3)

## 2021-03-08 NOTE — Progress Notes (Signed)
Results given by phone VM to coworker Jovanka at pt's request. Reviewed hypercalcemia sx and foods as above. Advised coworker to call RN if she or pt has any questions or concerns.

## 2021-03-08 NOTE — Progress Notes (Signed)
Noted patient asked results be given to coworker/no further questions or concerns

## 2021-04-01 NOTE — Progress Notes (Signed)
Reviewed RN Rolly Salter note next follow up with Be Well 2023 labs unless patient becomes symptomatic for hypercalcemia.

## 2021-04-08 ENCOUNTER — Encounter: Payer: Self-pay | Admitting: *Deleted

## 2021-04-26 ENCOUNTER — Ambulatory Visit: Payer: Self-pay | Admitting: *Deleted

## 2021-04-26 ENCOUNTER — Other Ambulatory Visit: Payer: Self-pay

## 2021-04-26 DIAGNOSIS — R7303 Prediabetes: Secondary | ICD-10-CM

## 2021-04-26 NOTE — Progress Notes (Signed)
Recheck A1c 3 months. 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

## 2021-04-26 NOTE — Addendum Note (Signed)
Addended by: Loraine Grip on: 04/26/2021 10:28 AM   Modules accepted: Orders

## 2021-04-27 LAB — HGB A1C W/O EAG: Hgb A1c MFr Bld: 5.8 % — ABNORMAL HIGH (ref 4.8–5.6)

## 2021-05-04 NOTE — Addendum Note (Signed)
Addended by: Albina Billet A on: 05/04/2021 11:17 AM   Modules accepted: Orders

## 2021-05-27 ENCOUNTER — Other Ambulatory Visit: Payer: Self-pay | Admitting: Nurse Practitioner

## 2021-05-29 ENCOUNTER — Other Ambulatory Visit: Payer: Self-pay | Admitting: Nurse Practitioner

## 2021-08-21 IMAGING — DX DG CHEST 1V PORT
1 series · 1 of 1 positions shown · non-contrast
Comparison: 10/31/2004

CLINICAL DATA: Dyspnea

EXAM:
PORTABLE CHEST 1 VIEW

[chest]
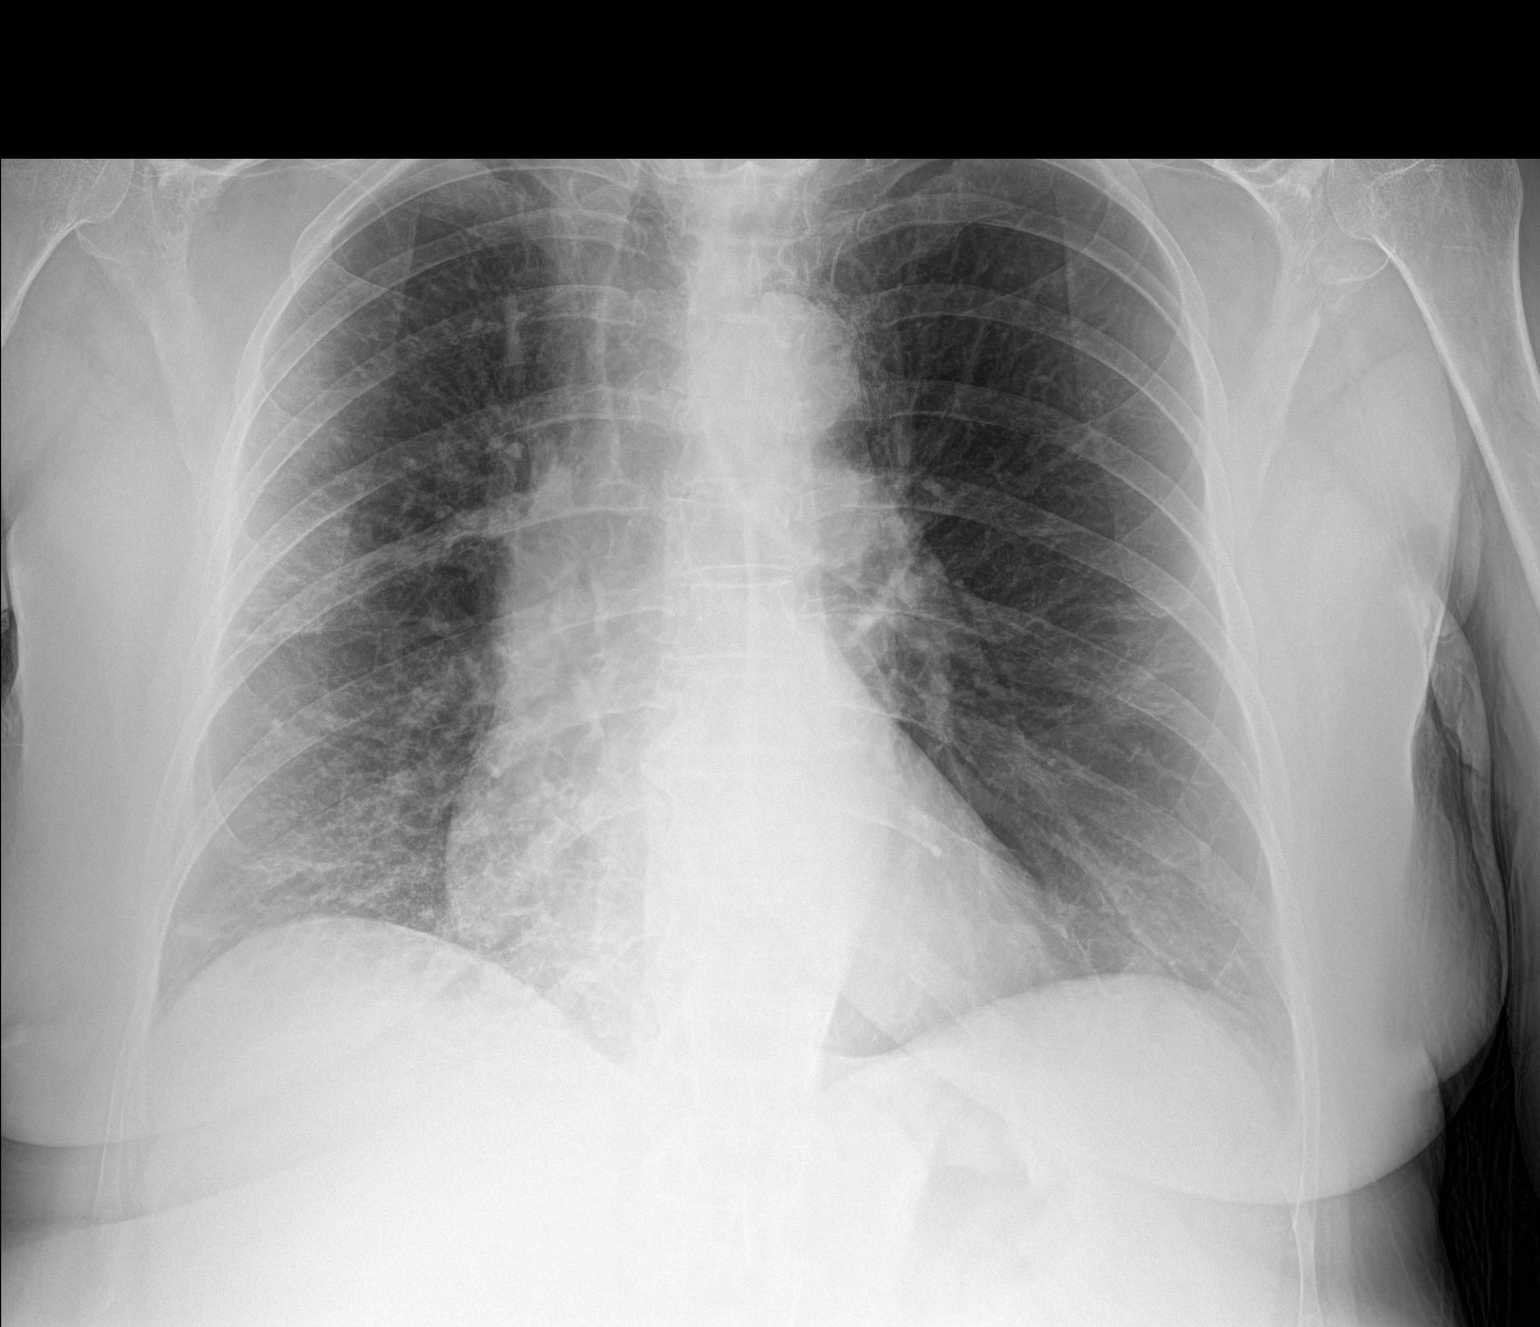

[1 of 1 positions shown; findings below may reference images not displayed]

FINDINGS: The lungs are symmetrically well expanded. Patchy peripheral
interstitial and airspace infiltrate is seen within the mid and
lower lung zones, likely infectious or inflammatory in the acute
setting. No pneumothorax or pleural effusion. Cardiac size within
normal limits. No acute bone abnormality.
IMPRESSION: Patchy multifocal pulmonary infiltrates, likely infectious or
inflammatory in the acute setting.

## 2021-08-27 ENCOUNTER — Encounter: Payer: Self-pay | Admitting: Nurse Practitioner

## 2021-08-27 ENCOUNTER — Ambulatory Visit: Payer: No Typology Code available for payment source | Admitting: Nurse Practitioner

## 2021-08-27 ENCOUNTER — Other Ambulatory Visit: Payer: Self-pay

## 2021-08-27 VITALS — BP 125/88 | HR 80 | Temp 97.3°F | Ht 67.0 in | Wt 186.4 lb

## 2021-08-27 DIAGNOSIS — M545 Low back pain, unspecified: Secondary | ICD-10-CM

## 2021-08-27 DIAGNOSIS — F419 Anxiety disorder, unspecified: Secondary | ICD-10-CM | POA: Diagnosis not present

## 2021-08-27 DIAGNOSIS — R5383 Other fatigue: Secondary | ICD-10-CM

## 2021-08-27 DIAGNOSIS — Z5320 Procedure and treatment not carried out because of patient's decision for unspecified reasons: Secondary | ICD-10-CM

## 2021-08-27 DIAGNOSIS — R319 Hematuria, unspecified: Secondary | ICD-10-CM

## 2021-08-27 DIAGNOSIS — R82998 Other abnormal findings in urine: Secondary | ICD-10-CM | POA: Diagnosis not present

## 2021-08-27 DIAGNOSIS — E7800 Pure hypercholesterolemia, unspecified: Secondary | ICD-10-CM

## 2021-08-27 DIAGNOSIS — Z1231 Encounter for screening mammogram for malignant neoplasm of breast: Secondary | ICD-10-CM

## 2021-08-27 DIAGNOSIS — Z13820 Encounter for screening for osteoporosis: Secondary | ICD-10-CM

## 2021-08-27 LAB — POCT URINALYSIS DIP (CLINITEK)
Bilirubin, UA: NEGATIVE
Glucose, UA: NEGATIVE mg/dL
Ketones, POC UA: NEGATIVE mg/dL
Nitrite, UA: NEGATIVE
POC PROTEIN,UA: NEGATIVE
Spec Grav, UA: 1.025 (ref 1.010–1.025)
Urobilinogen, UA: 0.2 E.U./dL
pH, UA: 5 (ref 5.0–8.0)

## 2021-08-27 NOTE — Progress Notes (Signed)
? ?[facility] ?[place], [place]  [address] ?Phone:  [phone]   Fax:  [phone] ? ? ?Established Patient Office Visit ? ?Subjective:  ?Patient ID: Melanie Arroyo, female    DOB: 02/03/1956  Age: 66 y.o. MRN: [id number] ? ?CC:  ?Chief Complaint  ?Patient presents with  ? Follow-up  ?  Pt is here today to discuss her lower back and leg pains associated with tingling sensations x 1 month. ?Pt has also been feeling very fatigue and weak and her anxiety level has increased as well. ?  ? ?HPI ?Melanie Arroyo presents for back pain. She  has a past medical history of Hyperlipidemia.  ? ?Back Pain ?Patient presents for evaluation of low back problems. Symptoms have been present for 1 month and include paresthesias in legs . Initial inciting event: none. Alleviating factors identifiable by the patient are none. Aggravating factors identifiable by the patient are  activity . She has heaviness in her legs with a tired and sore feeling. She has slowed down quite a bit. 5-6/10. APAP is not effective.  ? ?Anxiety ?Patient is here for evaluation of anxiety.  She has the following anxiety symptoms: chest pain, palpitations, shortness of breath along with a band like. Onset of symptoms was approximately several years ago.  Symptoms have been gradually worsening since that time. She denies current suicidal and homicidal ideation. Possible organic causes contributing are:  APAP . Risk factors: previous episode of depression Previous treatment includes medication BuSpar and alprazolam  .   She complains of the following medication side effects: sedation; too strong.  ?Past Medical History:  ?Diagnosis Date  ? Hyperlipidemia   ? ? ?History reviewed. No pertinent surgical history. ? ?Family History  ?Family history unknown: Yes  ? ? ?Social History  ? ?Socioeconomic History  ? Marital status: Married  ?  Spouse name: Not on file  ? Number of children: Not on file  ? Years of education: Not on file   ? Highest education level: Not on file  ?Occupational History  ? Not on file  ?Tobacco Use  ? Smoking status: Every Day  ?  Packs/day: 1.00  ?  Types: Cigarettes  ? Smokeless tobacco: Never  ?Vaping Use  ? Vaping Use: Never used  ?Substance and Sexual Activity  ? Alcohol use: Not Currently  ? Drug use: Never  ? Sexual activity: Yes  ?Other Topics Concern  ? Not on file  ?Social History Narrative  ? Not on file  ? ?Social Determinants of Health  ? ?Financial Resource Strain: Not on file  ?Food Insecurity: Not on file  ?Transportation Needs: Not on file  ?Physical Activity: Not on file  ?Stress: Not on file  ?Social Connections: Not on file  ?Intimate Partner Violence: Not on file  ? ? ?Outpatient Medications Prior to Visit  ?Medication Sig Dispense Refill  ? rosuvastatin (CRESTOR) 5 MG tablet Take 1 tablet by mouth once daily 90 tablet 0  ? albuterol (VENTOLIN HFA) 108 (90 Base) MCG/ACT inhaler Inhale 1-2 puffs into the lungs every 6 (six) hours as needed for wheezing or shortness of breath (protracted coughing). 8 g 0  ? nicotine (NICODERM CQ - DOSED IN MG/24 HOURS) 14 mg/24hr patch Place 1 patch (14 mg total) onto the skin daily. (Patient not taking: Reported on 12/23/2020) 28 patch 0  ? ?No facility-administered medications prior to visit.  ? ? ?No Known Allergies ? ?ROS ?Review of Systems ? ?  ?Objective:  ?  ?  Physical Exam ?Exam conducted with a chaperone present.  ?Constitutional:   ?   Appearance: She is obese.  ?HENT:  ?   Head: Normocephalic and atraumatic.  ?Cardiovascular:  ?   Rate and Rhythm: Normal rate.  ?Pulmonary:  ?   Effort: Pulmonary effort is normal.  ?Chest:  ?   Chest wall: No mass.  ?Breasts: ?   Right: Normal.  ?   Left: Normal.  ?Musculoskeletal:     ?   General: Normal range of motion.  ?Lymphadenopathy:  ?   Upper Body:  ?   Right upper body: No supraclavicular, axillary or pectoral adenopathy.  ?   Left upper body: No supraclavicular, axillary or pectoral adenopathy.  ?Skin: ?   General:  Skin is warm and dry.  ?   Capillary Refill: Capillary refill takes less than 2 seconds.  ?Neurological:  ?   General: No focal deficit present.  ?   Mental Status: She is alert and oriented to person, place, and time.  ?Psychiatric:     ?   Mood and Affect: Mood normal.     ?   Behavior: Behavior normal.     ?   Thought Content: Thought content normal.     ?   Judgment: Judgment normal.  ? ? ?BP 125/88   Pulse 80   Temp (!) 97.3 ?F (36.3 ?C)   Ht _0  (1.702 m)   Wt 186 lb 6.4 oz (84.6 kg)   SpO2 98%   BMI 29.19 kg/m?  ?Wt Readings from Last 3 Encounters:  ?08/27/21 186 lb 6.4 oz (84.6 kg)  ?12/23/20 196 lb 0.2 oz (88.9 kg)  ?06/10/20 194 lb (88 kg)  ? ? ? ?Health Maintenance Due  ?Topic Date Due  ? Hepatitis C Screening  Never done  ? COLONOSCOPY (Pts 45-17yr Insurance coverage will need to be confirmed)  Never done  ? MAMMOGRAM  Never done  ? ? ?There are no preventive care reminders to display for this patient. ? ?Lab Results  ?Component Value Date  ? TSH 0.651 08/27/2021  ? ?Lab Results  ?Component Value Date  ? WBC 8.6 08/27/2021  ? HGB 14.7 08/27/2021  ? HCT 45.0 08/27/2021  ? MCV 87 08/27/2021  ? PLT 214 08/27/2021  ? ?Lab Results  ?Component Value Date  ? NA 143 08/27/2021  ? K 3.9 08/27/2021  ? CO2 26 04/27/2020  ? GLUCOSE 152 (H) 08/27/2021  ? BUN 18 08/27/2021  ? CREATININE 0.76 08/27/2021  ? BILITOT 0.4 08/27/2021  ? ALKPHOS 102 08/27/2021  ? AST 15 08/27/2021  ? ALT 19 12/23/2020  ? PROT 6.5 08/27/2021  ? ALBUMIN 4.6 08/27/2021  ? CALCIUM 10.1 08/27/2021  ? ANIONGAP 13 04/27/2020  ? EGFR 87 08/27/2021  ? ?Lab Results  ?Component Value Date  ? CHOL 153 08/27/2021  ? ?Lab Results  ?Component Value Date  ? HDL 42 08/27/2021  ? ?Lab Results  ?Component Value Date  ? LMohall81 08/27/2021  ? ?Lab Results  ?Component Value Date  ? TRIG 176 (H) 08/27/2021  ? ?Lab Results  ?Component Value Date  ? CHOLHDL 3.6 08/27/2021  ? ?Lab Results  ?Component Value Date  ? HGBA1C 5.8 (H) 04/26/2021  ? ? ?   ?Assessment & Plan:  ? ?Problem List Items Addressed This Visit   ? ?  ? Other  ? Elevated cholesterol ?Reevaluation pending  ? Relevant Orders  ? Lipid panel (Completed)  ? ?Other Visit Diagnoses   ? ?  Anxiety    -  Primary ?Hydroxyzine 25 mg 3 times daily as needed  ? Relevant Orders  ? TSH (Completed)  ? Bilateral low back pain, unspecified chronicity, unspecified whether sciatica present     ?Worsening ?Evaluation pending  ? Relevant Orders  ? POCT URINALYSIS DIP (CLINITEK) (Completed)  ? Comp. Metabolic Panel (12) (Completed)  ? Leukocytes in urine      ? Relevant Orders  ? Urine Culture (Completed)  ? CBC with Differential/Platelet (Completed)  ? Hematuria, unspecified type      ? Relevant Orders  ? Urine Culture (Completed)  ? Fatigue, unspecified type      ? Relevant Orders  ? Vitamin B12 (Completed)  ? Magnesium (Completed)  ? Colon cancer screening declined      ? Relevant Orders  ? Cologuard  ? Screening mammogram for breast cancer      ? Relevant Orders  ? MM 3D SCREEN BREAST BILATERAL  ? Screening for osteoporosis      ? Relevant Orders  ? HM DEXA SCAN (Completed)  ? ?  ? ? ?No orders of the defined types were placed in this encounter. ? ? ?Follow-up: No follow-ups on file.  ? ? ?Vevelyn Francois, NP ?

## 2021-08-28 ENCOUNTER — Other Ambulatory Visit: Payer: Self-pay | Admitting: Nurse Practitioner

## 2021-08-28 LAB — CBC WITH DIFFERENTIAL/PLATELET
Basophils Absolute: 0.1 10*3/uL (ref 0.0–0.2)
Basos: 1 %
EOS (ABSOLUTE): 0 10*3/uL (ref 0.0–0.4)
Eos: 1 %
Hematocrit: 45 % (ref 34.0–46.6)
Hemoglobin: 14.7 g/dL (ref 11.1–15.9)
Immature Grans (Abs): 0 10*3/uL (ref 0.0–0.1)
Immature Granulocytes: 0 %
Lymphocytes Absolute: 1.8 10*3/uL (ref 0.7–3.1)
Lymphs: 21 %
MCH: 28.4 pg (ref 26.6–33.0)
MCHC: 32.7 g/dL (ref 31.5–35.7)
MCV: 87 fL (ref 79–97)
Monocytes Absolute: 0.3 10*3/uL (ref 0.1–0.9)
Monocytes: 4 %
Neutrophils Absolute: 6.4 10*3/uL (ref 1.4–7.0)
Neutrophils: 73 %
Platelets: 214 10*3/uL (ref 150–450)
RBC: 5.18 x10E6/uL (ref 3.77–5.28)
RDW: 13.1 % (ref 11.7–15.4)
WBC: 8.6 10*3/uL (ref 3.4–10.8)

## 2021-08-28 LAB — COMP. METABOLIC PANEL (12)
AST: 15 IU/L (ref 0–40)
Albumin/Globulin Ratio: 2.4 — ABNORMAL HIGH (ref 1.2–2.2)
Albumin: 4.6 g/dL (ref 3.8–4.8)
Alkaline Phosphatase: 102 IU/L (ref 44–121)
BUN/Creatinine Ratio: 24 (ref 12–28)
BUN: 18 mg/dL (ref 8–27)
Bilirubin Total: 0.4 mg/dL (ref 0.0–1.2)
Calcium: 10.1 mg/dL (ref 8.7–10.3)
Chloride: 105 mmol/L (ref 96–106)
Creatinine, Ser: 0.76 mg/dL (ref 0.57–1.00)
Globulin, Total: 1.9 g/dL (ref 1.5–4.5)
Glucose: 152 mg/dL — ABNORMAL HIGH (ref 70–99)
Potassium: 3.9 mmol/L (ref 3.5–5.2)
Sodium: 143 mmol/L (ref 134–144)
Total Protein: 6.5 g/dL (ref 6.0–8.5)
eGFR: 87 mL/min/{1.73_m2} (ref >59)

## 2021-08-28 LAB — LIPID PANEL
Chol/HDL Ratio: 3.6 ratio (ref 0.0–4.4)
Cholesterol, Total: 153 mg/dL (ref 100–199)
HDL: 42 mg/dL (ref >39)
LDL Chol Calc (NIH): 81 mg/dL (ref 0–99)
Triglycerides: 176 mg/dL — ABNORMAL HIGH (ref 0–149)
VLDL Cholesterol Cal: 30 mg/dL (ref 5–40)

## 2021-08-28 LAB — MAGNESIUM: Magnesium: 2.2 mg/dL (ref 1.6–2.3)

## 2021-08-28 LAB — VITAMIN B12: Vitamin B-12: 285 pg/mL (ref 232–1245)

## 2021-08-28 LAB — TSH: TSH: 0.651 u[IU]/mL (ref 0.450–4.500)

## 2021-08-30 LAB — URINE CULTURE

## 2021-08-30 MED ORDER — HYDROXYZINE PAMOATE 25 MG PO CAPS: 25.0000 mg | ORAL_CAPSULE | Freq: Three times a day (TID) | ORAL | 0 refills | Status: DC | PRN

## 2021-10-28 ENCOUNTER — Other Ambulatory Visit: Payer: Self-pay | Admitting: *Deleted

## 2021-10-28 DIAGNOSIS — R7303 Prediabetes: Secondary | ICD-10-CM

## 2021-10-29 LAB — HGB A1C W/O EAG: Hgb A1c MFr Bld: 5.8 % — ABNORMAL HIGH (ref 4.8–5.6)

## 2021-10-29 NOTE — Progress Notes (Signed)
Pt reported at time of draw that she has not had any significant dietary or weight changes since last A1c check in October. Reviewed recommendations for lean proteins, fresh fruits and vegetables, limiting processed foods, exercise 185min/week. Pt to come by clinic later today for hard copy of results.

## 2021-10-29 NOTE — Progress Notes (Signed)
Noted reviewed RN Haley note agreed with plan of care 

## 2022-03-04 ENCOUNTER — Ambulatory Visit: Payer: No Typology Code available for payment source | Admitting: Nurse Practitioner

## 2022-04-05 ENCOUNTER — Encounter: Payer: Self-pay | Admitting: *Deleted

## 2022-04-06 ENCOUNTER — Encounter: Payer: Self-pay | Admitting: Neurology

## 2022-04-06 ENCOUNTER — Ambulatory Visit: Payer: No Typology Code available for payment source | Admitting: Neurology

## 2022-04-06 VITALS — BP 125/74 | HR 101 | Ht 65.0 in | Wt 194.0 lb

## 2022-04-06 DIAGNOSIS — R0683 Snoring: Secondary | ICD-10-CM

## 2022-04-06 DIAGNOSIS — E669 Obesity, unspecified: Secondary | ICD-10-CM | POA: Diagnosis not present

## 2022-04-06 DIAGNOSIS — R519 Headache, unspecified: Secondary | ICD-10-CM

## 2022-04-06 DIAGNOSIS — G4719 Other hypersomnia: Secondary | ICD-10-CM | POA: Diagnosis not present

## 2022-04-06 DIAGNOSIS — F17200 Nicotine dependence, unspecified, uncomplicated: Secondary | ICD-10-CM

## 2022-04-06 DIAGNOSIS — R053 Chronic cough: Secondary | ICD-10-CM

## 2022-04-06 NOTE — Progress Notes (Signed)
Subjective:    Patient ID: Melanie Arroyo is a 66 y.o. female.  HPI    Huston Foley, MD, PhD [facility] [address] [address] [place], [place] [address]  Dear Melanie Arroyo,  I saw your patient, Melanie Arroyo upon your kind request in my sleep clinic today for initial consultation of her sleep disorder, in particular, concern for underlying obstructive sleep apnea.  The patient is accompanied by her son and [place] interpreter today.  As you know, Ms. Melanie Arroyo is a 66 year old female with an underlying medical history of chronic cough, smoking, hyperlipidemia, depression, anxiety, and mild obesity, who reports snoring and excessive daytime somnolence.  I reviewed your office note from 03/07/2022. It looks like she was supposed to have a chest x-ray done but she did not have one done.  She initially reported that she did not know why she was sent here as she sleeps well.  She does endorse snoring.  She does endorse some daytime tiredness and occasionally taking a nap.  She denies night to night nocturia but has occasionally woken up with a headache.  She lives with her son and her husband.  She has occasionally woken up with a sense of gasping for air.  She smokes a pack per day, she has tried to quit smoking but has not been successful, she tried nicotine patch but no prescription medicine other than that.  She drinks caffeine in the form of coffee, about 3 cups/day and 1 can of soda per day.  Bedtime is generally around 10 or 11 PM and rise time between 4 and 5 AM.  She has had some weight fluctuation a few years ago but stable since then.  She has no family history of sleep apnea that she knows of. Her Epworth sleepiness score is 12 out of 24, fatigue severity score is 47 out of 63.   Her Past Medical History Is Significant For: Past Medical History:  Diagnosis Date   Chronic cough    Depression    Fatigue    GAD (generalized anxiety disorder)    Hyperlipidemia      Her Past Surgical History Is Significant For: Past Surgical History:  Procedure Laterality Date   NO PAST SURGERIES      Her Family History Is Significant For: Family History  Problem Relation Age of Onset   High blood pressure Mother    Asthma Father    High blood pressure Father     Her Social History Is Significant For: Social History   Socioeconomic History   Marital status: Married    Spouse name: Not on file   Number of children: 5   Years of education: Not on file   Highest education level: High school graduate  Occupational History   Not on file  Tobacco Use   Smoking status: Every Day    Packs/day: 1.00    Types: Cigarettes   Smokeless tobacco: Never  Vaping Use   Vaping Use: Never used  Substance and Sexual Activity   Alcohol use: Not Currently   Drug use: Never   Sexual activity: Yes  Other Topics Concern   Not on file  Social History Narrative   Lives with husband and son   Right handed   Caffeine: coffee and soda, 2-3 cups/day   Social Determinants of Health   Financial Resource Strain: Not on file  Food Insecurity: Not on file  Transportation Needs: Not on file  Physical Activity: Not on file  Stress: Not on file  Social Connections: Not on file    Her Allergies Are:  No Known Allergies:   Her Current Medications Are:  Outpatient Encounter Medications as of 04/06/2022  Medication Sig   albuterol (VENTOLIN HFA) 108 (90 Base) MCG/ACT inhaler Inhale 2 puffs into the lungs as needed.   escitalopram (LEXAPRO) 5 MG tablet Take 5 mg by mouth daily.   hydrOXYzine (VISTARIL) 25 MG capsule Take 1 capsule (25 mg total) by mouth every 8 (eight) hours as needed.   rosuvastatin (CRESTOR) 5 MG tablet Take 1 tablet by mouth once daily   No facility-administered encounter medications on file as of 04/06/2022.  :   Review of Systems:  Out of a complete 14 point review of systems, all are reviewed and negative with the exception of these symptoms  as listed below:  Review of Systems  Neurological:        [facility]/Desiree Howley PA [phone]/[id number],PYP, fatigue, obesity, eval for OSA.  Here with son Melanie Arroyo (son) and Hinton Dyer (interpreter). ESS 12 FSS 47    Objective:  Neurological Exam  Physical Exam Physical Examination:   Vitals:   04/06/22 1229  BP: 125/74  Pulse: (!) 101    General Examination: The patient is a very pleasant 66 y.o. female in no acute distress. She appears well-developed and well-nourished and well groomed.   HEENT: Normocephalic, atraumatic, pupils are equal, round and reactive to light, extraocular tracking is good without limitation to gaze excursion or nystagmus noted. Hearing is grossly intact. Face is symmetric with normal facial animation. Speech is clear with no dysarthria noted. There is no hypophonia. There is no lip, neck/head, jaw or voice tremor. Neck is supple with full range of passive and active motion. There are no carotid bruits on auscultation. Oropharynx exam reveals: mild mouth dryness, adequate dental hygiene with full dentures mild to moderate airway crowding secondary to small airway entry.  Tonsils on the smaller side, slightly wider uvula.  Tongue protrudes centrally and palate elevates symmetrically.  Neck circumference of 15 three-quarter inches.    Chest: Clear to auscultation without wheezing, bibasilar crackles.  Intermittent coughing spells.    Heart: S1+S2+0, regular and normal without murmurs, rubs or gallops noted.   Abdomen: Soft, non-tender and non-distended.  Extremities: There is bilateral nonpitting puffiness around the ankles.    Skin: Warm and dry without trophic changes noted.   Musculoskeletal: exam reveals no obvious joint deformities.   Neurologically:  Mental status: The patient is awake, alert and oriented in all 4 spheres. Her immediate and remote memory, attention, language skills and fund of knowledge are appropriate. There is no evidence of  aphasia, agnosia, apraxia or anomia. Speech is clear with normal prosody and enunciation. Thought process is linear. Mood is normal and affect is normal.  Cranial nerves II - XII are as described above under HEENT exam.  Motor exam: Normal bulk, strength and tone is noted. There is no obvious action or resting tremor.  Fine motor skills and coordination: grossly intact.  Cerebellar testing: No dysmetria or intention tremor. There is no truncal or gait ataxia.  Sensory exam: intact to light touch in the upper and lower extremities.  Gait, station and balance: She stands easily. No veering to one side is noted. No leaning to one side is noted. Posture is age-appropriate and stance is narrow based. Gait shows normal stride length and normal pace. No problems turning are noted.   Assessment and Plan:  In summary, Melek Bach  is a very pleasant 66 y.o.-year old female with an underlying medical history of chronic cough, smoking, hyperlipidemia, depression, anxiety, and mild obesity, whose history and physical exam concerning for sleep disordered breathing, supporting a current working diagnosis of unspecified sleep apnea, with the main differential diagnoses of obstructive sleep apnea (OSA) versus upper airway resistance syndrome (UARS) versus central sleep apnea (CSA), or mixed sleep apnea. A laboratory attended sleep study is considered gold standard for evaluation of sleep disordered breathing and is recommended at this time and clinically justified.   I had a long chat with the patient and her son about my findings and the diagnosis of sleep apnea, particularly OSA, its prognosis and treatment options. We talked about medical/conservative treatments, surgical interventions and non-pharmacological approaches for symptom control. I explained, in particular, the risks and ramifications of untreated moderate to severe OSA, especially with respect to developing cardiovascular disease down the road, including  congestive heart failure (CHF), difficult to treat hypertension, cardiac arrhythmias (particularly A-fib), neurovascular complications including TIA, stroke and dementia. Even type 2 diabetes has, in part, been linked to untreated OSA. Symptoms of untreated OSA may include (but may not be limited to) daytime sleepiness, nocturia (i.e. frequent nighttime urination), memory problems, mood irritability and suboptimally controlled or worsening mood disorder such as depression and/or anxiety, lack of energy, lack of motivation, physical discomfort, as well as recurrent headaches, especially morning or nocturnal headaches. We talked about the importance of maintaining a healthy lifestyle and striving for healthy weight.  The importance of complete smoking cessation was also addressed.  In addition, she is advised to talk to you about getting her chest x-ray done as I noticed bibasilar crackles on auscultation. I recommended the following at this time: sleep study.  I outlined the differences between a laboratory attended sleep study which is considered more comprehensive and accurate over the option of a home sleep test (HST); the latter may lead to underestimation of sleep disordered breathing in some instances and does not help with diagnosing upper airway resistance syndrome and is not accurate enough to diagnose primary central sleep apnea typically.  She does not wish to come for a laboratory attended sleep study.  I ordered a home sleep test for that reason. I explained the different sleep test procedures to the patient in detail and also outlined possible surgical and non-surgical treatment options of OSA, including the use of a pressure airway pressure (PAP) device (ie CPAP, AutoPAP/APAP or BiPAP in certain circumstances), a custom-made dental device (aka oral appliance, which would require a referral to a specialist dentist or orthodontist typically, and is generally speaking not considered a good choice for  patients with full dentures or edentulous state), upper airway surgical options, such as traditional UPPP (which is not considered a first-line treatment) or the Inspire device (hypoglossal nerve stimulator, which would involve a referral for consultation with an ENT surgeon, after careful selection, following inclusion criteria). I explained the PAP treatment option to the patient in detail, as this is generally considered first-line treatment.  The patient indicated that she would be willing to try PAP therapy, if the need arises. I explained the importance of being compliant with PAP treatment, not only for insurance purposes but primarily to improve patient's symptoms symptoms, and for the patient's long term health benefit, including to reduce Her cardiovascular risks longer-term.    We will pick up our discussion about the next steps and treatment options after testing.  We will keep her posted as to  the test results by phone call and/or MyChart messaging where possible.  We will plan to follow-up in sleep clinic accordingly as well.  I answered all their questions today and the patient and her son were in agreement.   I encouraged her to call with any interim questions, concerns, problems or updates or email [place] through MyChart.  Generally speaking, sleep test authorizations may take up to 2 weeks, sometimes less, sometimes longer, the patient is encouraged to get in touch with [place] if they do not hear back from the sleep lab staff directly within the next 2 weeks.  Thank you very much for allowing me to participate in the care of this nice patient. If I can be of any further assistance to you please do not hesitate to call me at [phone].  Sincerely,   Huston Foley, MD, PhD

## 2022-04-06 NOTE — Patient Instructions (Addendum)
Thank you for choosing [facility] for your sleep related care! It was nice to meet you today!   Here is what we discussed today:   Please check with Desiree about the chest X ray she ordered, I did hear some crackles in your lungs.   Based on your symptoms and your exam I believe you are at risk for obstructive sleep apnea (aka OSA). We should proceed with a sleep study to determine whether you do or do not have OSA and how severe it is. Even, if you have mild OSA, I may want you to consider treatment with CPAP, as treatment of even borderline or mild sleep apnea can result and improvement of symptoms such as sleep disruption, daytime sleepiness, nighttime bathroom breaks, restless leg symptoms, improvement of headache syndromes, even improved mood disorder.   As explained, an attended sleep study (meaning you get to stay overnight in the sleep lab), lets [place] monitor sleep-related behaviors such as sleep talking and leg movements in sleep, in addition to monitoring for sleep apnea.  A home sleep test is a screening tool for sleep apnea diagnosis only, but unfortunately, does not help with any other sleep-related diagnoses.  Please remember, the long-term risks and ramifications of untreated moderate to severe obstructive sleep apnea may include (but are not limited to): increased risk for cardiovascular disease, including congestive heart failure, stroke, difficult to control hypertension, treatment resistant obesity, arrhythmias, especially irregular heartbeat commonly known as A. Fib. (atrial fibrillation); even type 2 diabetes has been linked to untreated OSA.   Other correlations that untreated obstructive sleep apnea include macular edema which is swelling of the retina in the eyes, droopy eyelid syndrome, and elevated hemoglobin and hematocrit levels (often referred to as polycythemia).  Sleep apnea can cause disruption of sleep and sleep deprivation in most cases, which, in  turn, can cause recurrent headaches, problems with memory, mood, concentration, focus, and vigilance. Most people with untreated sleep apnea report excessive daytime sleepiness, which can affect their ability to drive. Please do not drive or use heavy equipment or machinery, if you feel sleepy! Patients with sleep apnea can also develop difficulty initiating and maintaining sleep (aka insomnia).   Having sleep apnea may increase your risk for other sleep disorders, including involuntary behaviors sleep such as sleep terrors, sleep talking, sleepwalking.    Having sleep apnea can also increase your risk for restless leg syndrome and leg movements at night.   Please note that untreated obstructive sleep apnea may carry additional perioperative morbidity. Patients with significant obstructive sleep apnea (typically, in the moderate to severe degree) should receive, if possible, perioperative PAP (positive airway pressure) therapy and the surgeons and particularly the anesthesiologists should be informed of the diagnosis and the severity of the sleep disordered breathing.   We will call you or email you through MyChart with regards to your test results and plan a follow-up in sleep clinic accordingly. Most likely, you will hear from one of our nurses.   Our sleep lab administrative assistant will call you to schedule your sleep study and give you further instructions, regarding the check in process for the sleep study, arrival time, what to bring, when you can expect to leave after the study, etc., and to answer any other logistical questions you may have. If you don't hear back from her by about 2 weeks from now, please feel free to call her direct line at [phone] or you can call our general clinic number, or email [place]  through My Chart.

## 2022-05-31 ENCOUNTER — Telehealth: Payer: Self-pay | Admitting: Neurology

## 2022-05-31 NOTE — Telephone Encounter (Signed)
[employer] no auth req ref # [name] on 05/23/22   05/30/22 per interpreter unable to leave vmail KK

## 2022-08-22 ENCOUNTER — Telehealth: Payer: Self-pay | Admitting: Registered Nurse

## 2022-08-22 ENCOUNTER — Encounter: Payer: Self-pay | Admitting: Registered Nurse

## 2022-08-22 DIAGNOSIS — R7303 Prediabetes: Secondary | ICD-10-CM

## 2022-08-22 DIAGNOSIS — Z0000 Encounter for general adult medical examination without abnormal findings: Secondary | ICD-10-CM

## 2022-08-22 DIAGNOSIS — E7800 Pure hypercholesterolemia, unspecified: Secondary | ICD-10-CM

## 2022-08-22 DIAGNOSIS — E559 Vitamin D deficiency, unspecified: Secondary | ICD-10-CM

## 2022-08-22 NOTE — Telephone Encounter (Signed)
[facility] scheduled labs 08/25/22 fasting female executive panel, Hgba1c and Vitamin D ordered for patient.  RN [employer] notified.

## 2022-08-25 ENCOUNTER — Other Ambulatory Visit: Payer: No Typology Code available for payment source | Admitting: Occupational Medicine

## 2022-08-25 DIAGNOSIS — Z0000 Encounter for general adult medical examination without abnormal findings: Secondary | ICD-10-CM

## 2022-08-25 NOTE — Progress Notes (Signed)
Lab drawn tolerated well no issues noted.

## 2022-08-26 LAB — VITAMIN D 25 HYDROXY (VIT D DEFICIENCY, FRACTURES): Vit D, 25-Hydroxy: 17.4 ng/mL — ABNORMAL LOW (ref 30.0–100.0)

## 2022-08-26 LAB — HEMOGLOBIN A1C
Est. average glucose Bld gHb Est-mCnc: 131 mg/dL
Hgb A1c MFr Bld: 6.2 % — ABNORMAL HIGH (ref 4.8–5.6)

## 2022-08-27 MED ORDER — CHOLECALCIFEROL 1.25 MG (50000 UT) PO TABS: 1.0000 | ORAL_TABLET | ORAL | 2 refills | Status: AC

## 2022-08-27 NOTE — Addendum Note (Signed)
Addended by: Gerarda Fraction A on: 08/27/2022 07:11 PM   Modules accepted: Orders

## 2022-08-27 NOTE — Progress Notes (Signed)
I added a lipid to what was sent to [facility] since rosuvastatin started September 2023 if unable to run now draw with next Hgba1c.  A1c less than 7 and BP 125/74 04/06/22 met [facility] requirements please sign paperwork with patient and ensure she is taking her statin.  3 month blood sugar (HgbA1c) elevated worsening.  Your vitamin D level was low.  Follow up with PCM elevated cholesterol, blood sugar and low Vitamin D.  Exitcare handouts on vitamin D deficiency, prediabetes, prediabetes diet, calorie counting for weight loss, cholesterol, high fiber diet and [employer] dietitian information please give to patient.  The dietitian offers free visits. Link to make appt  [facility] ([email or web address]) I recommend 15 minutes sunlight on skin daily and start cholecalciferol 50,000 units weekly take with fattiest meal for best absorption and repeat level in 6 months not fasting. I sent Rx to your pharmacy Weight, BP (blood pressure) check and Hgab1c nonfasting in 3 months.   I recommend weight loss 5-10 lbs, exercise 150 minutes per week; 20 grams women per up to date; eat whole grains/fruits/vegetables; keep added sugars to less than 100 calories/ 5 teaspoons for women per American Heart Association;  electrolytes, iron, kidney/liver/thyroid function and complete blood count normal. Ask patient if she prefers printed copies of results and/or handouts  Avoid dehydration and drink water to keep urine pale yellow clear and voiding every 2-4 hours while awake.  Ask what her typical daily diet is

## 2022-08-29 ENCOUNTER — Other Ambulatory Visit: Payer: Self-pay | Admitting: Occupational Medicine

## 2022-08-29 DIAGNOSIS — E7800 Pure hypercholesterolemia, unspecified: Secondary | ICD-10-CM

## 2022-08-30 LAB — LIPID PANEL WITH LDL/HDL RATIO
Cholesterol, Total: 166 mg/dL (ref 100–199)
HDL: 42 mg/dL (ref >39)
LDL Chol Calc (NIH): 95 mg/dL (ref 0–99)
LDL/HDL Ratio: 2.3 ratio (ref 0.0–3.2)
Triglycerides: 168 mg/dL — ABNORMAL HIGH (ref 0–149)
VLDL Cholesterol Cal: 29 mg/dL (ref 5–40)

## 2022-08-30 LAB — SPECIMEN STATUS REPORT

## 2022-08-31 ENCOUNTER — Ambulatory Visit: Payer: No Typology Code available for payment source | Admitting: Occupational Medicine

## 2022-08-31 DIAGNOSIS — E7800 Pure hypercholesterolemia, unspecified: Secondary | ICD-10-CM

## 2022-08-31 DIAGNOSIS — R7303 Prediabetes: Secondary | ICD-10-CM

## 2022-08-31 DIAGNOSIS — Z0000 Encounter for general adult medical examination without abnormal findings: Secondary | ICD-10-CM

## 2022-08-31 DIAGNOSIS — E559 Vitamin D deficiency, unspecified: Secondary | ICD-10-CM

## 2022-08-31 NOTE — Progress Notes (Signed)
I added a lipid to what was sent to [facility] since rosuvastatin started September 2023. 3 month blood sugar (HgbA1c) elevated worsening.  Your vitamin D level was low.  Follow up with PCM elevated cholesterol, blood sugar and low Vitamin D.  Exitcare handouts on vitamin D deficiency, prediabetes, prediabetes diet, calorie counting for weight loss, cholesterol, high fiber diet and [employer] dietitian information please give to patient.  The dietitian offers free visits. Link to make appt  [facility] ([email or web address]) I emailed link to patient. NP recommend 15 minutes sunlight on skin daily and start cholecalciferol 50,000 units weekly take with fattiest meal for best absorption and repeat level in 6 months not fasting. NP sent Rx to your pharmacy Weight, BP (blood pressure) check and Hgab1c nonfasting in 3 months.   NP recommend weight loss 5-10 lbs, exercise 150 minutes per week; 20 grams women per up to date; eat whole grains/fruits/vegetables; keep added sugars to less than 100 calories/ 5 teaspoons for women per American Heart Association;  electrolytes, iron, kidney/liver/thyroid function and complete blood count normal.   Avoid dehydration and drink water to keep urine pale yellow clear and voiding every 2-4 hours while awake.   Triglycerides slightly elevated most likely related to elevated Hgba1c.  Ensure patient still taking her rosuvastatin daily.   Patient is taking medications she reports.   Be well insurance premium discount evaluation:   Patient is smoker tobacco cessation alteratives completed the [employer]hark video and quiz.   Patient completed PCM office visit epic reviewed by RN Kimrey and transcribed. Labs  Tobacco attestation signed. [facility] ROI formed signed. Forms placed in the chart.   Patient given handouts for [facility] pharmacies and discount drugs list,MyChart, Tele doc setup, Tele doc Behavioral, [place] counseling and [facility] counseling.  What to do for infectious illness  protocol. Given handout for list of medications that can be filled at [facility]. Given Clinic hours and Clinic Email.  Scheduled follow labs.

## 2022-10-26 ENCOUNTER — Emergency Department (HOSPITAL_BASED_OUTPATIENT_CLINIC_OR_DEPARTMENT_OTHER): Payer: No Typology Code available for payment source

## 2022-10-26 ENCOUNTER — Emergency Department (HOSPITAL_BASED_OUTPATIENT_CLINIC_OR_DEPARTMENT_OTHER)
Admission: EM | Admit: 2022-10-26 | Discharge: 2022-10-26 | Disposition: A | Discharge status: Home / Self Care | Payer: No Typology Code available for payment source | Attending: Emergency Medicine | Admitting: Emergency Medicine

## 2022-10-26 ENCOUNTER — Other Ambulatory Visit: Payer: Self-pay

## 2022-10-26 DIAGNOSIS — Z7951 Long term (current) use of inhaled steroids: Secondary | ICD-10-CM | POA: Insufficient documentation

## 2022-10-26 DIAGNOSIS — R079 Chest pain, unspecified: Secondary | ICD-10-CM

## 2022-10-26 DIAGNOSIS — J45909 Unspecified asthma, uncomplicated: Secondary | ICD-10-CM | POA: Diagnosis not present

## 2022-10-26 LAB — TROPONIN I (HIGH SENSITIVITY)
Troponin I (High Sensitivity): 3 ng/L (ref <18)
Troponin I (High Sensitivity): 3 ng/L (ref <18)

## 2022-10-26 LAB — BASIC METABOLIC PANEL
Anion gap: 10 (ref 5–15)
BUN: 18 mg/dL (ref 8–23)
CO2: 20 mmol/L — ABNORMAL LOW (ref 22–32)
Calcium: 9.7 mg/dL (ref 8.9–10.3)
Chloride: 107 mmol/L (ref 98–111)
Creatinine, Ser: 0.67 mg/dL (ref 0.44–1.00)
GFR, Estimated: >60 mL/min (ref >60)
Glucose, Bld: 130 mg/dL — ABNORMAL HIGH (ref 70–99)
Potassium: 3.8 mmol/L (ref 3.5–5.1)
Sodium: 137 mmol/L (ref 135–145)

## 2022-10-26 LAB — CBC
HCT: 45.6 % (ref 36.0–46.0)
Hemoglobin: 14.9 g/dL (ref 12.0–15.0)
MCH: 28.1 pg (ref 26.0–34.0)
MCHC: 32.7 g/dL (ref 30.0–36.0)
MCV: 86 fL (ref 80.0–100.0)
Platelets: 212 10*3/uL (ref 150–400)
RBC: 5.3 MIL/uL — ABNORMAL HIGH (ref 3.87–5.11)
RDW: 13.8 % (ref 11.5–15.5)
WBC: 8 10*3/uL (ref 4.0–10.5)
nRBC: 0 % (ref 0.0–0.2)

## 2022-10-26 LAB — D-DIMER, QUANTITATIVE: D-Dimer, Quant: 0.34 ug/mL-FEU (ref 0.00–0.50)

## 2022-10-26 LAB — BRAIN NATRIURETIC PEPTIDE: B Natriuretic Peptide: 12.3 pg/mL (ref 0.0–100.0)

## 2022-10-26 MED ORDER — ACETAMINOPHEN 500 MG PO TABS
1000.0000 mg | ORAL_TABLET | Freq: Once | ORAL | Status: AC
  Administered 2022-10-26: 1000 mg via ORAL
  Filled 2022-10-26: qty 2

## 2022-10-26 MED ORDER — FAMOTIDINE 20 MG PO TABS: 20.0000 mg | ORAL_TABLET | Freq: Two times a day (BID) | ORAL | 0 refills | Status: AC

## 2022-10-26 MED ORDER — ALUM & MAG HYDROXIDE-SIMETH 200-200-20 MG/5ML PO SUSP
30.0000 mL | Freq: Once | ORAL | Status: AC
  Administered 2022-10-26: 30 mL via ORAL
  Filled 2022-10-26: qty 30

## 2022-10-26 MED ORDER — LIDOCAINE VISCOUS HCL 2 % MT SOLN
15.0000 mL | Freq: Once | OROMUCOSAL | Status: AC
  Administered 2022-10-26: 15 mL via ORAL
  Filled 2022-10-26: qty 15

## 2022-10-26 MED ORDER — OMEPRAZOLE 20 MG PO CPDR: 20.0000 mg | DELAYED_RELEASE_CAPSULE | Freq: Every day | ORAL | 0 refills | Status: AC

## 2022-10-26 MED ORDER — ALBUTEROL SULFATE HFA 108 (90 BASE) MCG/ACT IN AERS
1.0000 | INHALATION_SPRAY | Freq: Once | RESPIRATORY_TRACT | Status: AC
  Administered 2022-10-26: 2 via RESPIRATORY_TRACT
  Filled 2022-10-26: qty 6.7

## 2022-10-26 NOTE — ED Notes (Signed)
D/c paperwork reviewed with pt, including prescriptions and follow up care.  No questions or concerns voiced at time of d/c. . Pt verbalized understanding, Ambulatory with family to ED exit, NAD.   

## 2022-10-26 NOTE — ED Triage Notes (Signed)
Patient presents to ED via POV from home. Here with chest pain that began 3-5 days ago. Denies nausea or vomiting. Patient sent from urgent care.

## 2022-10-26 NOTE — Discharge Instructions (Addendum)
Please take your medications as prescribed. Take tylenol/ibuprofen for pain. I recommend close follow-up with cardiology and PCP for reevaluation.  Please do not hesitate to return to emergency department if worrisome signs symptoms we discussed become apparent.

## 2022-10-26 NOTE — ED Provider Notes (Signed)
[place] [facility] AT [facility] Provider Note   CSN: [id number] Arrival date & time: 10/26/22  1301     History  Chief Complaint  Patient presents with   Chest Pain    Melanie Arroyo is a 67 y.o. female with a past medical history of hyperlipidemia, asthma presents today for evaluation of chest pain.  Patient states she has had ongoing chest pain in the last several months but got worse in the last 5 days.  States the pain is located under her breasts on both sides and radiates around to her back.  Denies any aggravating or relieving factors.  Rates pain 2 out of 10.  States she took Tylenol at home with some relief.  Denies any history of acid reflux.  States she went to urgent care today and was sent to the ER due to abnormal EKG.  Denies fever, chest pain, shortness of breath, extremity weakness or numbness.  Denies any recent travel or surgery.  No history of DVT/PE.  Denies hematemesis, history of cancer.  States she has on and off leg swelling.   Chest Pain     Past Medical History:  Diagnosis Date   Chronic cough    Depression    Fatigue    GAD (generalized anxiety disorder)    Hyperlipidemia    Past Surgical History:  Procedure Laterality Date   NO PAST SURGERIES       Home Medications Prior to Admission medications   Medication Sig Start Date End Date Taking? Authorizing Provider  albuterol (VENTOLIN HFA) 108 (90 Base) MCG/ACT inhaler Inhale 2 puffs into the lungs as needed. 03/07/22 03/07/23  [provider]  Cholecalciferol 1.25 MG (50000 UT) TABS Take 1 tablet by mouth once a week for 36 doses. 08/27/22 04/30/23  Betancourt, Jarold Song, NP  escitalopram (LEXAPRO) 5 MG tablet Take 5 mg by mouth daily. 03/07/22   [provider]  hydrOXYzine (VISTARIL) 25 MG capsule Take 25 mg by mouth daily as needed for anxiety. 03/07/22   [provider]  rosuvastatin (CRESTOR) 10 MG tablet Take 10 mg by mouth at bedtime.    [provider]      Allergies    Patient has no known allergies.    Review of Systems   Review of Systems  Cardiovascular:  Positive for chest pain.    Physical Exam Updated Vital Signs BP (!) 143/91 (BP Location: Left Arm)   Pulse 91   Temp 98 F (36.7 C)   Resp 20   Ht 5\' 6"  (1.676 m)   Wt 91.2 kg   SpO2 97%   BMI 32.44 kg/m  Physical Exam Vitals and nursing note reviewed.  Constitutional:      Appearance: Normal appearance.  HENT:     Head: Normocephalic and atraumatic.     Mouth/Throat:     Mouth: Mucous membranes are moist.  Eyes:     General: No scleral icterus. Cardiovascular:     Rate and Rhythm: Normal rate and regular rhythm.     Pulses: Normal pulses.     Heart sounds: Normal heart sounds.  Pulmonary:     Effort: Pulmonary effort is normal.     Breath sounds: Normal breath sounds.  Abdominal:     General: Abdomen is flat.     Palpations: Abdomen is soft.     Tenderness: There is no abdominal tenderness.  Musculoskeletal:        General: No deformity.  Skin:  General: Skin is warm.     Findings: No rash.  Neurological:     General: No focal deficit present.     Mental Status: She is alert.  Psychiatric:        Mood and Affect: Mood normal.     ED Results / Procedures / Treatments   Labs (all labs ordered are listed, but only abnormal results are displayed) Labs Reviewed  BASIC METABOLIC PANEL - Abnormal; Notable for the following components:      Result Value   CO2 20 (*)    Glucose, Bld 130 (*)    All other components within normal limits  CBC - Abnormal; Notable for the following components:   RBC 5.30 (*)    All other components within normal limits  TROPONIN I (HIGH SENSITIVITY)  TROPONIN I (HIGH SENSITIVITY)    EKG EKG Interpretation  Date/Time:  Wednesday Oct 26 2022 13:14:33 EDT Ventricular Rate:  87 PR Interval:  144 QRS Duration: 146 QT Interval:  399 QTC Calculation: 480 R Axis:   120 Text Interpretation: Sinus  rhythm RBBB and LPFB Inferior infarct, old Confirmed by Ernie Avena ([id number]) on 10/26/2022 1:35:53 PM  Radiology DG Chest 2 View  Result Date: 10/26/2022 CLINICAL DATA:  Chest pain EXAM: CHEST - 2 VIEW COMPARISON:  Two-view chest x-ray 05/27/2020 FINDINGS: The heart size is normal. The lungs are clear. No edema or effusion is present. No focal airspace disease is present. IMPRESSION: No acute cardiopulmonary disease. Electronically Signed   By: Marin Roberts M.D.   On: 10/26/2022 13:44    Procedures Procedures    Medications Ordered in ED Medications - No data to display  ED Course/ Medical Decision Making/ A&P Clinical Course as of 10/26/22 1646  Wed Oct 26, 2022  1643 Calcium: 9.7 [KL]    Clinical Course User Index [KL] Jeanelle Malling, PA                             Medical Decision Making Amount and/or Complexity of Data Reviewed Labs: ordered. Radiology: ordered.   This patient presents to the ED for chest pain, this involves an extensive number of treatment options, and is a complaint that carries with a high risk of complications and morbidity.  The differential diagnosis includes ACS, pericarditis, PE, pneumothorax, pneumonia, less likely dissection with essentially normal blood pressure, symmetric bilateral pulses, and no back pain.  This is not an exhaustive list.  Lab tests: I ordered and personally interpreted labs.  The pertinent results include: WBC unremarkable. Hbg unremarkable. Platelets unremarkable. Electrolytes unremarkable. BUN, creatinine unremarkable.  Delta troponin negative D-dimer negative.  BNP normal.  Imaging studies: I ordered imaging studies. I personally reviewed, interpreted imaging and agree with the radiologist's interpretations. The results include: X-ray unremarkable.  Problem list/ ED course/ Critical interventions/ Medical management: HPI: See above Vital signs within normal range and stable throughout visit. Laboratory/imaging studies  significant for: See above. On physical examination, patient is afebrile and appears in no acute distress. Exam without evidence of volume overload, BNP normal so doubt heart failure. EKG showed right bundle branch block without signs of active ischemia. Given the timing of pain to ER presentation, delta troponin was negative so doubt NSTEMI. Presentation not consistent with acute PE (Wells low risk), pneumothorax (not visualized on chest xr), thoracic aortic dissection, pericarditis, tamponade, pneumonia (no infectious symptoms, clear chest xr), myocarditis (no recent illness, neg trop). Given Tylenol, Maalox and viscous lidocaine  here.  Reevaluation of patient after the medication showed of the patient improved. HEART score:1 so plan to discharge patient home with cardiology and PCP follow-up.   I have reviewed the patient home medicines and have made adjustments as needed.  Cardiac monitoring/EKG: The patient was maintained on a cardiac monitor.  I personally reviewed and interpreted the cardiac monitor which showed an underlying rhythm of: sinus rhythm.  Additional history obtained: External records from outside source obtained and reviewed including: Chart review including previous notes, labs, imaging.  Consultations obtained:  Disposition Continued outpatient therapy. Follow-up with PCP and cardiology recommended for reevaluation of symptoms. Treatment plan discussed with patient.  Pt acknowledged understanding was agreeable to the plan. Worrisome signs and symptoms were discussed with patient, and patient acknowledged understanding to return to the ED if they noticed these signs and symptoms. Patient was stable upon discharge.   This chart was dictated using voice recognition software.  Despite best efforts to proofread,  errors can occur which can change the documentation meaning.          Final Clinical Impression(s) / ED Diagnoses Final diagnoses:  Chest pain, unspecified type     Rx / DC Orders ED Discharge Orders          Ordered    famotidine (PEPCID) 20 MG tablet  2 times daily        10/26/22 1648    omeprazole (PRILOSEC) 20 MG capsule  Daily        10/26/22 1648    Ambulatory referral to Cardiology       Comments: If you have not heard from the Cardiology office within the next 72 hours please call [phone].   10/26/22 1648              Jeanelle Malling, PA 10/26/22 1654    Tegeler, Canary Brim, MD 10/26/22 [phone]

## 2022-10-27 ENCOUNTER — Telehealth: Payer: Self-pay

## 2022-10-27 NOTE — Transitions of Care (Post Inpatient/ED Visit) (Signed)
   10/27/2022  Name: Melanie Arroyo MRN: [id number] DOB: 1956-02-05  Today's TOC FU Call Status: Today's TOC FU Call Status:: Unsuccessul Call (1st Attempt) Unsuccessful Call (1st Attempt) Date: 10/27/22  Attempted to reach the patient regarding the most recent Inpatient/ED visit.  Follow Up Plan: Additional outreach attempts will be made to reach the patient to complete the Transitions of Care (Post Inpatient/ED visit) call.   Signature Renelda Loma RMA

## 2022-10-28 ENCOUNTER — Telehealth: Payer: Self-pay

## 2022-10-28 NOTE — Transitions of Care (Post Inpatient/ED Visit) (Signed)
   10/28/2022  Name: Melanie Arroyo MRN: [id number] DOB: Dec 31, 1955  Today's TOC FU Call Status: Today's TOC FU Call Status:: Unsuccessful Call (2nd Attempt) Unsuccessful Call (2nd Attempt) Date: 10/28/22  Attempted to reach the patient regarding the most recent Inpatient/ED visit.  Follow Up Plan: Additional outreach attempts will be made to reach the patient to complete the Transitions of Care (Post Inpatient/ED visit) call.   Signature Renelda Loma RMA

## 2022-10-31 ENCOUNTER — Telehealth: Payer: Self-pay

## 2022-10-31 NOTE — Transitions of Care (Post Inpatient/ED Visit) (Signed)
   10/31/2022  Name: Faline Sluder MRN: [id number] DOB: Sep 13, 1955  Today's TOC FU Call Status: Today's TOC FU Call Status:: Successful TOC FU Call Competed TOC FU Call Complete Date: 10/31/22  Transition Care Management Follow-up Telephone Call Discharge Facility: [facility] Type of Discharge: Emergency Department Reason for ED Visit: Other: (dizzy and sob) How have you been since you were released from the hospital?: Better Any questions or concerns?: No  Items Reviewed: Did you receive and understand the discharge instructions provided?: Yes Medications obtained,verified, and reconciled?: Yes (Medications Reviewed) Any new allergies since your discharge?: No Dietary orders reviewed?: NA Do you have support at home?: Yes People in Home: child(ren), adult  Medications Reviewed Today: Medications Reviewed Today     Reviewed by Barbaraann Barthel, NP (Nurse Practitioner) on 08/22/22 at 1527  Med List Status: <None>   Medication Order Taking? Sig Documenting Provider Last Dose Status Informant  albuterol (VENTOLIN HFA) 108 (90 Base) MCG/ACT inhaler [id number]  Inhale 2 puffs into the lungs as needed. [provider]  Active   escitalopram (LEXAPRO) 5 MG tablet [id number]  Take 5 mg by mouth daily. [provider]  Active   hydrOXYzine (VISTARIL) 25 MG capsule [id number]  Take 1 capsule (25 mg total) by mouth every 8 (eight) hours as needed. Barbette Merino, NP  Active   rosuvastatin (CRESTOR) 5 MG tablet [id number]  Take 1 tablet by mouth once daily Barbette Merino, NP  Active             Home Care and Equipment/Supplies: Were Home Health Services Ordered?: NA Any new equipment or medical supplies ordered?: NA  Functional Questionnaire: Do you need assistance with bathing/showering or dressing?: No Do you need assistance with meal preparation?: No Do you need assistance with eating?: No Do you have difficulty maintaining continence: No Do you need  assistance with getting out of bed/getting out of a chair/moving?: No Do you have difficulty managing or taking your medications?: No  Follow up appointments reviewed: PCP Follow-up appointment confirmed?: Yes Date of PCP follow-up appointment?: 11/11/22 Follow-up Provider: Pt advise Dr. Victorino Sparrow Follow-up appointment confirmed?: NA Do you need transportation to your follow-up appointment?: No Do you understand care options if your condition(s) worsen?: Yes-patient verbalized understanding    SIGNATURE Renelda Loma RMA

## 2022-12-11 NOTE — Progress Notes (Signed)
Noted patient has had follow up labs 

## 2023-01-22 ENCOUNTER — Other Ambulatory Visit: Payer: Self-pay | Admitting: Registered Nurse

## 2023-01-22 DIAGNOSIS — E559 Vitamin D deficiency, unspecified: Secondary | ICD-10-CM

## 2023-01-22 DIAGNOSIS — R7303 Prediabetes: Secondary | ICD-10-CM

## 2023-01-22 NOTE — Progress Notes (Signed)
Patient met with RN Bess Kinds 08/31/22 discussed lab results and recommendations given printout of results/results note.  Saw PCM 11/11/22 after ED visit and cardiology 11/22/22  PCM has labs scheduled 03/08/23 was started on rosuvastatin Sep 2023; LDL 95 Hgba1c 6.2 worsening BP 125/74 weight 194lbs met requirements for 2025 FY insurance discount provider [employer] [facility] paperwork completed 09/01/22 RN Kimrey notified HR team My chart message sent to patient Annie, There were changes to national guidelines for vitamin D testing/supplements.  We will not be drawing vitamin D levels at [facility] any longer.  Please take your prescription 50,000 units by mouth once a week with meal and when that is finished start 2000 units by mouth over the counter daily with meal.  I have cancelled your vitamin D lab.  I do recommend having follow up blood sugar drawn nonfasting with RN Kimrey or you can ask your primary care to check with scheduled labs 03/08/23.  Please let us know if you have further questions.  Albina Billet NP-C

## 2023-05-04 ENCOUNTER — Telehealth: Payer: Self-pay | Admitting: Registered Nurse

## 2023-05-04 ENCOUNTER — Encounter: Payer: Self-pay | Admitting: Registered Nurse

## 2023-05-04 DIAGNOSIS — E559 Vitamin D deficiency, unspecified: Secondary | ICD-10-CM

## 2023-05-04 MED ORDER — VITAMIN D3 50 MCG (2000 UT) PO CAPS: 2000.0000 [IU] | ORAL_CAPSULE | Freq: Every day | ORAL | 3 refills | Status: AC

## 2023-05-04 NOTE — Telephone Encounter (Signed)
Patient had vitamin D level drawn Feb 2024 low started on 50,000 units po weekly x 36 doses.  Patient has completed.  New national guidelines recommend 2000 units po daily with meal no further routine lab testing.  Patient to follow up with [facility]  Patient notified Rx sent to her pharmacy 2000 units po daily #90 RF0 cholecalciferol take with meal

## 2023-09-05 ENCOUNTER — Encounter: Payer: Self-pay | Admitting: Registered Nurse

## 2023-09-05 ENCOUNTER — Telehealth: Payer: Self-pay | Admitting: Registered Nurse

## 2023-09-05 DIAGNOSIS — Z0000 Encounter for general adult medical examination without abnormal findings: Secondary | ICD-10-CM

## 2023-09-05 NOTE — Telephone Encounter (Signed)
 Patient signed up for [facility] lab draw 09/07/2023  Executive panel with Hgba1c fasting ordered for patient.  See RN Olegario Messier for BP/weight check also and to sign her paperwork and discuss lab results.

## 2023-09-07 ENCOUNTER — Other Ambulatory Visit

## 2023-09-07 DIAGNOSIS — Z0000 Encounter for general adult medical examination without abnormal findings: Secondary | ICD-10-CM

## 2023-09-08 LAB — CMP12+LP+TP+TSH+6AC+CBC/D/PLT
ALT: 16 IU/L (ref 0–32)
AST: 19 IU/L (ref 0–40)
Albumin: 4.4 g/dL (ref 3.9–4.9)
Alkaline Phosphatase: 96 IU/L (ref 44–121)
BUN/Creatinine Ratio: 16 (ref 12–28)
BUN: 11 mg/dL (ref 8–27)
Basophils Absolute: 0.1 10*3/uL (ref 0.0–0.2)
Basos: 1 %
Bilirubin Total: 0.5 mg/dL (ref 0.0–1.2)
Calcium: 10 mg/dL (ref 8.7–10.3)
Chloride: 103 mmol/L (ref 96–106)
Chol/HDL Ratio: 3.4 ratio (ref 0.0–4.4)
Cholesterol, Total: 153 mg/dL (ref 100–199)
Creatinine, Ser: 0.67 mg/dL (ref 0.57–1.00)
EOS (ABSOLUTE): 0.1 10*3/uL (ref 0.0–0.4)
Eos: 1 %
Estimated CHD Risk: 0.5 times avg. (ref 0.0–1.0)
Free Thyroxine Index: 2.1 (ref 1.2–4.9)
GGT: 14 IU/L (ref 0–60)
Globulin, Total: 2.4 g/dL (ref 1.5–4.5)
Glucose: 97 mg/dL (ref 70–99)
HDL: 45 mg/dL (ref >39)
Hematocrit: 44.5 % (ref 34.0–46.6)
Hemoglobin: 14.2 g/dL (ref 11.1–15.9)
Immature Grans (Abs): 0 10*3/uL (ref 0.0–0.1)
Immature Granulocytes: 0 %
Iron: 63 ug/dL (ref 27–139)
LDH: 201 IU/L (ref 119–226)
LDL Chol Calc (NIH): 86 mg/dL (ref 0–99)
Lymphocytes Absolute: 2.1 10*3/uL (ref 0.7–3.1)
Lymphs: 28 %
MCH: 28.1 pg (ref 26.6–33.0)
MCHC: 31.9 g/dL (ref 31.5–35.7)
MCV: 88 fL (ref 79–97)
Monocytes Absolute: 0.3 10*3/uL (ref 0.1–0.9)
Monocytes: 4 %
Neutrophils Absolute: 5 10*3/uL (ref 1.4–7.0)
Neutrophils: 66 %
Phosphorus: 2.8 mg/dL — ABNORMAL LOW (ref 3.0–4.3)
Platelets: 214 10*3/uL (ref 150–450)
Potassium: 4.4 mmol/L (ref 3.5–5.2)
RBC: 5.05 x10E6/uL (ref 3.77–5.28)
RDW: 13.4 % (ref 11.7–15.4)
Sodium: 140 mmol/L (ref 134–144)
T3 Uptake Ratio: 24 % (ref 24–39)
T4, Total: 8.9 ug/dL (ref 4.5–12.0)
TSH: 1.04 u[IU]/mL (ref 0.450–4.500)
Total Protein: 6.8 g/dL (ref 6.0–8.5)
Triglycerides: 123 mg/dL (ref 0–149)
Uric Acid: 5 mg/dL (ref 3.0–7.2)
VLDL Cholesterol Cal: 22 mg/dL (ref 5–40)
WBC: 7.6 10*3/uL (ref 3.4–10.8)
eGFR: 96 mL/min/{1.73_m2} (ref >59)

## 2023-09-08 LAB — HGB A1C W/O EAG: Hgb A1c MFr Bld: 6.1 % — ABNORMAL HIGH (ref 4.8–5.6)

## 2023-09-18 ENCOUNTER — Ambulatory Visit

## 2023-09-27 NOTE — Telephone Encounter (Signed)
 LDL less than 130, BP less than 135/85 and A1c less than 7 met requirements for 2026 [employer] well insurance premium discount evaluation:   BP 133/81 LDL 86 Hgba1c 6.1 weight 210lbs BMI 34.9 previous year 125/79, 95, 6.2, 194lbs 32   Patient is a smoker given [employer] tobacco cessation video link and quiz 09/26/2023 and notified must be turned in prior to 31 Jul and video watched in entirety for insurance discount   Epic reviewed by NP and transcribed labs to [employer] forms and signed by provider.  Tobacco attestation signed. [facility] [employer] formed signed by patient with RN Olegario Messier. Forms placed in paper chart located at [facility].    Notified of [facility] hours RN M-Th [phone]; NP Tuesday 09-12 and Thursday 11a-2p and Clinic Email [email or web address]).   Patient verbalized understanding information/instructions, agreed with plan of care and had no further questions at this time.

## 2023-09-28 NOTE — Telephone Encounter (Signed)
 Patient completed [employer] smoking cessation video and quiz scored 10/10  [facility] form completed and sent to HR Tonya 09/28/2023 that patient met requirements for insurance discount starting 27 Mar 2024

## 2023-12-18 ENCOUNTER — Ambulatory Visit: Payer: Self-pay | Admitting: Registered Nurse

## 2024-03-16 ENCOUNTER — Telehealth: Payer: Self-pay | Admitting: Registered Nurse

## 2024-03-16 ENCOUNTER — Encounter: Payer: Self-pay | Admitting: Registered Nurse

## 2024-03-16 DIAGNOSIS — R7303 Prediabetes: Secondary | ICD-10-CM

## 2024-06-10 ENCOUNTER — Encounter: Payer: Self-pay | Admitting: Registered Nurse

## 2024-07-18 ENCOUNTER — Encounter: Payer: Self-pay | Admitting: Internal Medicine
